# Patient Record
Sex: Male | Born: 1955 | Race: White | Hispanic: No | Marital: Married | Smoking: Former smoker
Health system: Southern US, Community
[De-identification: ages and names within clinical notes are randomized; demographics above are authoritative.]

## PROBLEM LIST (undated history)

## (undated) DIAGNOSIS — M1990 Unspecified osteoarthritis, unspecified site: Secondary | ICD-10-CM

## (undated) DIAGNOSIS — R7303 Prediabetes: Secondary | ICD-10-CM

## (undated) DIAGNOSIS — J90 Pleural effusion, not elsewhere classified: Secondary | ICD-10-CM

## (undated) HISTORY — PX: COLONOSCOPY: SHX174

## (undated) HISTORY — PX: TONSILLECTOMY: SUR1361

## (undated) HISTORY — PX: WISDOM TOOTH EXTRACTION: SHX21

## (undated) HISTORY — DX: Pleural effusion, not elsewhere classified: J90

---

## 2005-05-22 ENCOUNTER — Ambulatory Visit: Payer: Self-pay | Admitting: Sports Medicine

## 2005-06-19 ENCOUNTER — Ambulatory Visit: Payer: Self-pay | Admitting: Sports Medicine

## 2005-07-31 ENCOUNTER — Ambulatory Visit: Payer: Self-pay | Admitting: Sports Medicine

## 2005-10-23 ENCOUNTER — Ambulatory Visit: Payer: Self-pay | Admitting: Sports Medicine

## 2006-04-30 ENCOUNTER — Ambulatory Visit: Payer: Self-pay | Admitting: Family Medicine

## 2006-11-12 ENCOUNTER — Ambulatory Visit: Payer: Self-pay | Admitting: Sports Medicine

## 2006-11-12 DIAGNOSIS — M545 Low back pain, unspecified: Secondary | ICD-10-CM | POA: Insufficient documentation

## 2006-11-12 DIAGNOSIS — S63259A Unspecified dislocation of unspecified finger, initial encounter: Secondary | ICD-10-CM | POA: Insufficient documentation

## 2008-10-21 ENCOUNTER — Ambulatory Visit: Payer: Self-pay | Admitting: Sports Medicine

## 2008-10-21 DIAGNOSIS — M5430 Sciatica, unspecified side: Secondary | ICD-10-CM | POA: Insufficient documentation

## 2009-12-21 ENCOUNTER — Ambulatory Visit: Payer: Self-pay | Admitting: Sports Medicine

## 2009-12-21 DIAGNOSIS — M79609 Pain in unspecified limb: Secondary | ICD-10-CM | POA: Insufficient documentation

## 2010-02-02 ENCOUNTER — Ambulatory Visit: Payer: Self-pay | Admitting: Sports Medicine

## 2010-04-18 NOTE — Assessment & Plan Note (Signed)
Summary: F/U L CALF,MC   Vital Signs:  Patient profile:   55 year old male BP sitting:   139 / 92  Vitals Entered By: Lillia Pauls CMA (February 02, 2010 11:43 AM)  CC:  f/u L calf.  History of Present Illness: 55yo male to office for f/u of left partial calf tear. 85-90% improved. Running 20 miles per week without significant pain. Continues to use compression sleeve.  Doing home exercises regularly. Denies any bruising or swelling. Denies nighttime pain  Allergies: No Known Drug Allergies PMH-FH-SH reviewed for relevance  Review of Systems      See HPI  Physical Exam  General:  Well-developed,well-nourished,in no acute distress; alert,appropriate and cooperative throughout examination Msk:  LOWER EXT: - L calf: no gross deformity, no atrophy.  Palpable nodule in proximal, medial gastroc, area mildly TTP.  No erythema or bruising.  No achilles pain.  Normal strength at the ankle.  Able to do heel raises without difficutly.  Toe walk without difficutly.  Neurovascularly intact distally Additional Exam:  MSK U/S:  Left calf - hypoechoic region in proximal, medial gastroc.  Increased doppler flow in area.  Appears similar in size to previous u/s.  Images saved.   Impression & Recommendations:  Problem # 1:  CALF PAIN, LEFT (ICD-729.5)  - MSK u/s reveals persistent hypoechoic area consistent with partial gastroc tear with hematoma.  Appears slightly improved compared to previous u/s. - Cont wearing calf sleeve for compression - Cont. calf exercises/heel raises - Advance activity as tolerated - f/u as needed  Orders: [place] LIMITED ([id number])  Complete Medication List: 1)  Neurontin 600 Mg Tabs (Gabapentin) .... Take 1 tablet by mouth tid 2)  Tramadol Hcl 50 Mg Tabs (Tramadol hcl) .... Take 1 tablet by mouth every six hours   Orders Added: 1)  Est. Patient Level III [64403] 2)  [place] LIMITED [[id number]]

## 2010-04-18 NOTE — Assessment & Plan Note (Signed)
Summary: CALF PAIN,MC   Vital Signs:  Patient profile:   55 year old male Height:      70 inches Weight:      195 pounds BMI:     28.08 Pulse rate:   54 / minute BP sitting:   134 / 86  (right arm)  Vitals Entered By: Rochele Pages RN (December 21, 2009 11:47 AM) CC: lt calf pain x1 yr   CC:  lt calf pain x1 yr.  History of Present Illness: Patient presents to clinic today for lt calf pain for 1 year and refills on tramadol and neurontin.   He states that the pain started about 1 year ago during a run, he felt like his calf muscle pulled.   Patient continues to have a tightness in the calf during running that has decreased his mileage, in half as he is now just doing 2-3 miles at a time on the treadmill. He runs approx 14-15 miles per week.  The pain changes in locations moving from lt achilles up to the back of knee. Pain not present at night.  Mostly just feels a slight tugging sensation.  Back - overall doing well.  Tramadol and neurontin are working well.  Allergies: No Known Drug Allergies  Physical Exam  General:  Well-developed,well-nourished,in no acute distress; alert,appropriate and cooperative throughout examination Msk:  L calf: + more protuberant feel compared to the R calf.  Gross visualization shows obvious increased diameter of L calf > R calf.  + TTP on medial head of gastroc.  Nl strength with plantar/dorsaflexion and ever/inver.  Toe walks without issue.  Limited MSK [place] L calf: hypoechoic region deep to medial gastroc c/w pooled blood.  No active flow in that region. Neurologic:  alert & oriented X3.     Impression & Recommendations:  Problem # 1:  LOW BACK PAIN, CHRONIC (ICD-724.2) Assessment Unchanged Pain at baseline, tramadol 50 mg two times a day and neurontin 600 mg at bedtime refilled today  His updated medication list for this problem includes:    Tramadol Hcl 50 Mg Tabs (Tramadol hcl) .Marland Kitchen... Take 1 tablet by mouth every six hours  Problem # 2:   CALF PAIN, LEFT (ICD-729.5) Assessment: New  Based on history, PE, and [place], this is most c/w gastroc tear and residual blood pool that has not entirely reabsorbed. In addition, he likely has some non healed fibers and partially torn vessel branches that are continuing to bleed. - fitted with calf sleeve today - reviewed calf stretches and strength exercises today - ok to continue moderate exercise, but stop if sig pain to prevent full tear - f/u 6 weeks for repeat US scan  Orders: [place] LIMITED ([id number]) Garment,belt,sleeve or other covering ,elastic or similar stretch (U0454)  Complete Medication List: 1)  Neurontin 600 Mg Tabs (Gabapentin) .... Take 1 tablet by mouth tid 2)  Tramadol Hcl 50 Mg Tabs (Tramadol hcl) .... Take 1 tablet by mouth every six hours Prescriptions: TRAMADOL HCL 50 MG TABS (TRAMADOL HCL) Take 1 tablet by mouth every six hours  #120 x 6   Entered by:   Enid Baas MD   Authorized by:   Corbin Ade MD   Signed by:   Enid Baas MD on 12/21/2009   Method used:   Faxed to ...       [facility] [id number] (retail)       [address]       [place]  [place], [place]  [address]       Ph: [phone] or [phone]       Fax: [phone]   RxID:   [id number] NEURONTIN 600 MG TABS (GABAPENTIN) Take 1 tablet by mouth tid  #90 x 3   Entered by:   Enid Baas MD   Authorized by:   Corbin Ade MD   Signed by:   Enid Baas MD on 12/21/2009   Method used:   Faxed to ...       [facility] [id number] (retail)       [address]       [place], [place]  [address]       Ph: [phone] or [phone]       Fax: [phone]   RxID:   [id number]

## 2010-06-01 ENCOUNTER — Encounter: Payer: Self-pay | Admitting: *Deleted

## 2010-12-12 ENCOUNTER — Other Ambulatory Visit: Payer: Self-pay | Admitting: *Deleted

## 2010-12-12 MED ORDER — GABAPENTIN 600 MG PO TABS: 600.0000 mg | ORAL_TABLET | Freq: Three times a day (TID) | ORAL | Status: DC

## 2011-08-23 ENCOUNTER — Other Ambulatory Visit: Payer: Self-pay | Admitting: *Deleted

## 2011-08-23 MED ORDER — GABAPENTIN 600 MG PO TABS: 600.0000 mg | ORAL_TABLET | Freq: Three times a day (TID) | ORAL | Status: DC

## 2011-08-23 MED ORDER — TRAMADOL HCL 50 MG PO TABS: 50.0000 mg | ORAL_TABLET | Freq: Four times a day (QID) | ORAL | Status: DC | PRN

## 2011-08-23 NOTE — Progress Notes (Signed)
Refilled per Dr. Darrick Penna.  Asked that pt make a f/u appt within the next month.

## 2011-08-27 ENCOUNTER — Ambulatory Visit (INDEPENDENT_AMBULATORY_CARE_PROVIDER_SITE_OTHER): Payer: 59 | Admitting: Sports Medicine

## 2011-08-27 VITALS — BP 128/70

## 2011-08-27 DIAGNOSIS — M545 Low back pain, unspecified: Secondary | ICD-10-CM

## 2011-08-27 DIAGNOSIS — M79609 Pain in unspecified limb: Secondary | ICD-10-CM

## 2011-08-27 DIAGNOSIS — M5430 Sciatica, unspecified side: Secondary | ICD-10-CM

## 2011-08-27 MED ORDER — TRAMADOL HCL 50 MG PO TABS: 50.0000 mg | ORAL_TABLET | Freq: Four times a day (QID) | ORAL | Status: DC | PRN

## 2011-08-27 MED ORDER — GABAPENTIN 600 MG PO TABS: 600.0000 mg | ORAL_TABLET | Freq: Three times a day (TID) | ORAL | Status: DC

## 2011-08-27 NOTE — Assessment & Plan Note (Signed)
Pain is well controlled with low dose tramadol Use this medicine over next 12 mos  Reck then

## 2011-08-27 NOTE — Assessment & Plan Note (Signed)
Now having some pain in tarsal tunnel/ post tib  Keep up custom orthotics for running Try scaphoid pads in regular work shoes Take pressure off tarsal tunnel  Recheck if this returns

## 2011-08-27 NOTE — Assessment & Plan Note (Signed)
Well controlled with gabapentin  We are not weaning as he has had difficulty with sciatic sxs when he cuts back

## 2011-08-27 NOTE — Progress Notes (Signed)
Patient ID: Justin Mcdaniel, male   DOB: 09-Jul-1955, 56 y.o.   MRN: [id number]  HPI: Pt reports chronic low back pain for "many years" (since 2005).  Has been on the same med regimen for many years-- gabapentin 600 TID + tramadol 50mg  qAM and qPM.  Will occasionally try to cut back on the meds as rec'd by Dr. Darrick Penna, but will start having increased pain, including pain radiating down right leg.  When taking this regimen, he reports no pain, no numbness/tingling, no other problems/issues.  Did have one episode of pain on medial side of left ankle while running.  Pain began a few days ago 3.5 min into a run, and he was able to run for his full 12 min.  Can feel the area, but it is not painful.  No swelling or bruising around the area.  Full ROM. Cont to use custom orthotics Not using any support in his work shoes  PE: Filed Vitals:   08/27/11 0920  BP: 128/70   Gen: NAD, pleasant  Back: Negative straight leg raise bilaterally Decreased ROM 10-20 degrees on FABER on right Good ROM of RT hip Good abduction and rotation strength  Ankle (left): Full ROM Ligaments stable, w/o laxity No swelling or edema No TTP  Resting pronation is significant for ankle

## 2015-06-07 ENCOUNTER — Encounter: Payer: Self-pay | Admitting: Sports Medicine

## 2015-06-07 ENCOUNTER — Ambulatory Visit (INDEPENDENT_AMBULATORY_CARE_PROVIDER_SITE_OTHER): Payer: 59 | Admitting: Sports Medicine

## 2015-06-07 VITALS — BP 131/92 | HR 97 | Ht 70.0 in | Wt 195.0 lb

## 2015-06-07 DIAGNOSIS — M5431 Sciatica, right side: Secondary | ICD-10-CM

## 2015-06-07 DIAGNOSIS — R269 Unspecified abnormalities of gait and mobility: Secondary | ICD-10-CM

## 2015-06-07 NOTE — Assessment & Plan Note (Signed)
Patient was fitted for a : standard, cushioned, semi-rigid orthotic. The orthotic was heated and afterward the patient stood on the orthotic blank positioned on the orthotic stand. The patient was positioned in subtalar neutral position and 10 degrees of ankle dorsiflexion in a weight bearing stance. After completion of molding, a stable base was applied to the orthotic blank. The blank was ground to a stable position for weight bearing. Size: 12 red EVA Base: blue Med density EVA Posting: none Additional orthotic padding:none  I spent 45 mins face to face for preparation and evaluation.  I spent more than 50% of time for discussion and counsleing of shoulder issue, left knee pain and use of orthotics for long term prevention of his calf and low back issues.

## 2015-06-07 NOTE — Progress Notes (Signed)
Patient ID: Justin Mcdaniel, male   DOB: [birth year], 60 y.o.   MRN: [id number]  CC; replace orthotics  - note patient also c/o RT shoulder and left knee pain recently  HPI Patient in orthotics for 10 years He had recurrent calf and lower leg pain This resolved in orthotics and has returned when he was off his orthotics Current orthotics > 60 years old and starting to break down so comes for new ones  RT shoulder pain Aches at night Hurts with movement x chest or heavy lift  LT knee Popping and tender shortly after finishing a 2 mi run on TM Hurt on ant knee milsswelling Resolved in 48 hours Concerned but no new pain  PEXAM NAD BP 131/92 mmHg  Pulse 97  Ht 5\' 10"  (1.778 m)  Wt 195 lb (88.451 kg)  BMI 27.98 kg/m2  Shoulder: Inspection reveals no abnormalities, atrophy or asymmetry. Palpation is normal with no tenderness over AC joint or bicipital groove. ROM is full in all planes. Rotator cuff strength normal throughout. No signs of impingement with negative Neer and Hawkin's tests, empty can. Speeds and Yergason's tests normal.  Probable  labral pathology noted with positive Obrien's,  negative clunk and good stability. Normal scapular function observed. No painful arc and no drop arm sign. No apprehension sign  Left knee Knee: Normal to inspection with no erythema or effusion or obvious bony abnormalities. Palpation normal with no warmth or joint line tenderness or patellar tenderness or condyle tenderness. ROM normal in flexion and extension and lower leg rotation. Ligaments with solid consistent endpoints including ACL, PCL, LCL, MCL. Negative Mcmurray's and provocative meniscal tests. Non painful patellar compression.but he does have some crepitation in upper quadrants of PF groove with compression Patellar and quadriceps tendons unremarkable. Hamstring and quadriceps strength is normal.  Feet  Loss of long arch Left foot shows a plantar callus Some loss of  transverse arch  Gait prior to orthotics is pronated  Assess: See problem list for gait and LBP RT shoulder with degenerative labrum - suggest maintain RC strength/ avoid IR with lifting or exercises  Left knee - exam normal today/ suspect he has had some small osteochondral fragment break lose but no significant DJD based on my exam today

## 2015-06-07 NOTE — Assessment & Plan Note (Signed)
Relieved with Gabapentin and tramadol Off both now Lessened with orthotics

## 2017-06-17 DIAGNOSIS — Z125 Encounter for screening for malignant neoplasm of prostate: Secondary | ICD-10-CM | POA: Diagnosis not present

## 2017-06-17 DIAGNOSIS — Z0000 Encounter for general adult medical examination without abnormal findings: Secondary | ICD-10-CM | POA: Diagnosis not present

## 2017-06-17 DIAGNOSIS — R7309 Other abnormal glucose: Secondary | ICD-10-CM | POA: Diagnosis not present

## 2017-06-19 DIAGNOSIS — Z87820 Personal history of traumatic brain injury: Secondary | ICD-10-CM | POA: Diagnosis not present

## 2017-06-19 DIAGNOSIS — Z0000 Encounter for general adult medical examination without abnormal findings: Secondary | ICD-10-CM | POA: Diagnosis not present

## 2017-06-19 DIAGNOSIS — Z8781 Personal history of (healed) traumatic fracture: Secondary | ICD-10-CM | POA: Diagnosis not present

## 2017-06-19 DIAGNOSIS — Z1212 Encounter for screening for malignant neoplasm of rectum: Secondary | ICD-10-CM | POA: Diagnosis not present

## 2017-06-19 DIAGNOSIS — J309 Allergic rhinitis, unspecified: Secondary | ICD-10-CM | POA: Diagnosis not present

## 2018-06-19 DIAGNOSIS — Z1159 Encounter for screening for other viral diseases: Secondary | ICD-10-CM | POA: Diagnosis not present

## 2018-06-19 DIAGNOSIS — Z125 Encounter for screening for malignant neoplasm of prostate: Secondary | ICD-10-CM | POA: Diagnosis not present

## 2018-06-19 DIAGNOSIS — Z13220 Encounter for screening for lipoid disorders: Secondary | ICD-10-CM | POA: Diagnosis not present

## 2018-06-19 DIAGNOSIS — Z0000 Encounter for general adult medical examination without abnormal findings: Secondary | ICD-10-CM | POA: Diagnosis not present

## 2018-06-23 DIAGNOSIS — M545 Low back pain: Secondary | ICD-10-CM | POA: Diagnosis not present

## 2018-06-23 DIAGNOSIS — Z23 Encounter for immunization: Secondary | ICD-10-CM | POA: Diagnosis not present

## 2018-06-23 DIAGNOSIS — Z0000 Encounter for general adult medical examination without abnormal findings: Secondary | ICD-10-CM | POA: Diagnosis not present

## 2018-06-23 DIAGNOSIS — F5104 Psychophysiologic insomnia: Secondary | ICD-10-CM | POA: Diagnosis not present

## 2018-06-23 DIAGNOSIS — R7309 Other abnormal glucose: Secondary | ICD-10-CM | POA: Diagnosis not present

## 2019-06-23 ENCOUNTER — Encounter: Payer: Self-pay | Admitting: Cardiovascular Disease

## 2019-06-23 DIAGNOSIS — Z0000 Encounter for general adult medical examination without abnormal findings: Secondary | ICD-10-CM | POA: Diagnosis not present

## 2019-06-23 DIAGNOSIS — Z125 Encounter for screening for malignant neoplasm of prostate: Secondary | ICD-10-CM | POA: Diagnosis not present

## 2019-06-26 DIAGNOSIS — Z0000 Encounter for general adult medical examination without abnormal findings: Secondary | ICD-10-CM | POA: Diagnosis not present

## 2019-06-26 DIAGNOSIS — J309 Allergic rhinitis, unspecified: Secondary | ICD-10-CM | POA: Diagnosis not present

## 2019-06-26 DIAGNOSIS — Z1212 Encounter for screening for malignant neoplasm of rectum: Secondary | ICD-10-CM | POA: Diagnosis not present

## 2019-06-26 DIAGNOSIS — R7309 Other abnormal glucose: Secondary | ICD-10-CM | POA: Diagnosis not present

## 2019-06-26 DIAGNOSIS — Z8249 Family history of ischemic heart disease and other diseases of the circulatory system: Secondary | ICD-10-CM | POA: Diagnosis not present

## 2019-06-26 DIAGNOSIS — Z87891 Personal history of nicotine dependence: Secondary | ICD-10-CM | POA: Diagnosis not present

## 2019-07-29 DIAGNOSIS — J438 Other emphysema: Secondary | ICD-10-CM | POA: Diagnosis not present

## 2019-07-29 DIAGNOSIS — Z79899 Other long term (current) drug therapy: Secondary | ICD-10-CM | POA: Diagnosis not present

## 2019-07-29 DIAGNOSIS — Z1159 Encounter for screening for other viral diseases: Secondary | ICD-10-CM | POA: Diagnosis not present

## 2019-07-29 DIAGNOSIS — J8410 Pulmonary fibrosis, unspecified: Secondary | ICD-10-CM | POA: Diagnosis not present

## 2019-07-29 DIAGNOSIS — I2510 Atherosclerotic heart disease of native coronary artery without angina pectoris: Secondary | ICD-10-CM | POA: Diagnosis not present

## 2019-08-06 ENCOUNTER — Encounter: Payer: Self-pay | Admitting: Cardiovascular Disease

## 2019-08-06 DIAGNOSIS — I2510 Atherosclerotic heart disease of native coronary artery without angina pectoris: Secondary | ICD-10-CM | POA: Diagnosis not present

## 2019-08-07 ENCOUNTER — Ambulatory Visit: Payer: BC Managed Care – PPO | Admitting: Internal Medicine

## 2019-08-07 ENCOUNTER — Other Ambulatory Visit: Payer: Self-pay

## 2019-08-07 ENCOUNTER — Encounter: Payer: Self-pay | Admitting: Internal Medicine

## 2019-08-07 VITALS — BP 130/80 | HR 50 | Temp 97.7°F | Ht 70.0 in | Wt 188.4 lb

## 2019-08-07 DIAGNOSIS — J8410 Pulmonary fibrosis, unspecified: Secondary | ICD-10-CM | POA: Diagnosis not present

## 2019-08-07 DIAGNOSIS — J432 Centrilobular emphysema: Secondary | ICD-10-CM

## 2019-08-07 NOTE — Progress Notes (Addendum)
Justin Mcdaniel    [id number]    1956-03-11  Primary Care Physician:Pharr, Thayer Jew, MD  Referring Physician: Deland Pretty, MD [address] [place],  [place] [address] Reason for Consultation: abnormal CT Chest Date of Consultation: 08/07/2019  Chief complaint:   Chief Complaint  Patient presents with  . Consult    pulmonary granuloma.  Emphysema.       HPI: Justin Mcdaniel is a 64 y.o. who presents for new patient evaluation of an abnormal CT Chest.  He has a significant cardiac history in the family.  He had a CT cardiac calcium score which was elevated. He had an echocardiogram done yesterday to follow up on this. Noted to have calcified pulmonary and splenic granulomas as well as emphysema on his CT scan as an incidental finding. Referred here for further discussion of these findings.    No shortness of breath, cough, pneumonia, bronchitis. No fevers chills night sweats or weight loss. Runs 4-5 miles a day. Splits his time between here and [place].   Had a pleural effusion in 2016 while in [place] and was treated with antibiotics. No residenual pain or symptoms.  Social history: Retired from [employer], environmental - worked in Scientist, water quality  Lived in [place] for the last 20 years.  Before that lived all over the [place].   Social History   Occupational History  . Not on file  Tobacco Use  . Smoking status: Former Smoker    Packs/day: 1.50    Years: 20.00    Pack years: 30.00    Types: Cigarettes    Quit date: 08/07/2010    Years since quitting: 9.0  . Smokeless tobacco: Never Used  Substance and Sexual Activity  . Alcohol use: Not on file  . Drug use: Not on file  . Sexual activity: Not on file    Relevant family history:  Family History  Problem Relation Age of Onset  . CAD Mother   . CAD Father   . CAD Brother     Past Medical History:  Diagnosis Date  . Pleural effusion     History reviewed. No pertinent surgical  history.  Physical Exam: Blood pressure 130/80, pulse (!) 50, temperature 97.7 F (36.5 C), temperature source Temporal, height 5\' 10"  (1.778 m), weight 188 lb 6.4 oz (85.5 kg), SpO2 98 %. Gen:      No acute distress ENT:  no nasal polyps, mucus membranes moist Lungs:    No increased respiratory effort, symmetric chest wall excursion, clear to auscultation bilaterally, no wheezes or crackles CV:         Regular rate and rhythm; no murmurs, rubs, or gallops.  No pedal edema Abd:      + bowel sounds; soft, non-tender; no distension MSK: no acute synovitis of DIP or PIP joints, no mechanics hands.  Skin:      Warm and dry; no rashes Neuro: normal speech, no focal facial asymmetry Psych: alert and oriented x3, normal mood and affect   Data Reviewed/Medical Decision Making:  Independent interpretation of tests: Imaging: Report of CT cardiac calcium scoring with emphysema, calcified granulomas and hilar adenopathy  ADDENDUM 5/26 Was able to review lung windows with patient's CT scan - mildly enlarged calcified hilar adenopathy. Mild centrilobular emphysema and sub cm granulomas noted.  PFTs: None on file  Labs: Labs reviewed from PCP. CBC and UA wnl.   Immunization status:  Immunization History  Administered Date(s) Administered  .  Influenza,inj,Quad PF,6+ Mos 12/08/2018  . Moderna SARS-COVID-2 Vaccination 07/02/2019, 07/23/2019  . Zoster 08/06/2017    . I reviewed prior external note(s) from Dr. Shelia Media . I reviewed the result(s) of the labs and imaging as noted above.  . 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  No further work up or follow up is needed.   Will need to obtain CD of his images for review. He will obtain and drop off.  Given that he is asymptomatic from his emphysema will hold off on any further testing or medications. He will let us know if this changes.    I spent 60 minutes in the care of this patient today including pre-charting, chart review, review of results, face-to-face care, coordination of care and communication with consultants etc.).  Return to Care: Return shortness of breath.  Lenice Llamas, MD Pulmonary and [place]  CC: Deland Pretty, MD

## 2019-08-07 NOTE — Patient Instructions (Signed)
Please drop off the CD from your imaging center to our office for our review.

## 2019-08-10 ENCOUNTER — Telehealth: Payer: Self-pay | Admitting: Internal Medicine

## 2019-08-10 NOTE — Telephone Encounter (Signed)
Dr. Shearon Stalls, please look out for disc.    Patient Instructions by Spero Geralds, MD at 08/07/2019 9:30 AM Author: Spero Geralds, MD Author Type: Physician Filed: 08/07/2019 9:56 AM  Note Status: Signed Cosign: Cosign Not Required Encounter Date: 08/07/2019  Editor: Spero Geralds, MD (Physician)    Please drop off the CD from your imaging center to our office for our review.     Instructions    Return shortness of breath. Please drop off the CD from your imaging center to our office for our review.        After Visit Summary (Printed 08/07/2019)

## 2019-08-11 NOTE — Telephone Encounter (Signed)
Checked Dr. Mauricio Po file up front and also in the cabinet in [place] and did not see a disc in there.  Heather or Dr. Shearon Stalls, please advise if you have the disc that was dropped off.

## 2019-08-12 NOTE — Telephone Encounter (Signed)
He wasn't feeling any symptoms of shortness of breath so I held off on ordering any further testing. I don't think it's absolutely essential he has any PFTs at this time - he can come see [place] again as needed if his symptoms change.

## 2019-08-12 NOTE — Telephone Encounter (Signed)
Will keep encounter open to call pt at requested time.

## 2019-08-12 NOTE — Telephone Encounter (Signed)
Attempted to call pt but unable to reach. Left message for him to return call. °

## 2019-08-12 NOTE — Telephone Encounter (Signed)
I reviewed the CT scan today and my findings are consistent with what was discussed in our office visit. He has mild emphysema and a few small calcified granulomas and lymph nodes which do not need any further follow up.  I've left the disc in my folder if he would like to come pick it up. Please call patient.

## 2019-08-12 NOTE — Telephone Encounter (Signed)
Pt returning a phone call. Pt can be reached at [phone]

## 2019-08-12 NOTE — Telephone Encounter (Signed)
Independent interpretation of tests: Imaging: Report of CT cardiac calcium scoring with emphysema, calcified granulomas and hilar adenopathy  ADDENDUM 5/26 Was able to review lung windows with patient's CT scan - mildly enlarged calcified hilar adenopathy. Mild centrilobular emphysema and sub cm granulomas noted.  PFTs: None on file  Labs: Labs reviewed from PCP. CBC and UA wnl.   Immunization status:      Immunization History  Administered Date(s) Administered  . Influenza,inj,Quad PF,6+ Mos 12/08/2018  . Moderna SARS-COVID-2 Vaccination 07/02/2019, 07/23/2019  . Zoster 08/06/2017    . I reviewed prior external note(s) from Dr. Shelia Media . I reviewed the result(s) of the labs and imaging as noted above.  . I have ordered PFT   Called and spoke with pt letting him know the info stated by Dr. Shearon Stalls in regards to the results of the CT from the disc that pt brought by and pt verbalized understanding. Stated to pt to follow plan that was discussed at [place] and while I was looking at the [place], saw that it stated that Dr. Shearon Stalls said that she had ordered PFT. After stating that to pt, pt stated he was told by Dr. Shearon Stalls that he did not need to schedule a follow up. No order was placed for the PFT; it was just mentioned in her notes.  Dr. Shearon Stalls, please advise if you were wanting pt to have a PFT or if that was just documented as a mistake.

## 2019-08-12 NOTE — Telephone Encounter (Signed)
Attempted to call pt to let him know the info stated by Dr. Shearon Stalls but unable to reach. When speaking with pt the prior time, pt stated it was okay to leave him a detailed message on machine. Left pt a detailed message with the info stated by Dr. Shearon Stalls. I have placed the disc up front in the file cabinet for pt to come and pick up when he returns back to town. Nothing further needed.

## 2019-08-12 NOTE — Telephone Encounter (Signed)
Patient is available at 2:00 pm or 4:00 pm. Patient phone number is [phone].

## 2019-09-04 ENCOUNTER — Encounter: Payer: Self-pay | Admitting: Cardiovascular Disease

## 2019-09-04 ENCOUNTER — Ambulatory Visit: Payer: BC Managed Care – PPO | Admitting: Cardiovascular Disease

## 2019-09-04 ENCOUNTER — Other Ambulatory Visit: Payer: Self-pay

## 2019-09-04 DIAGNOSIS — E782 Mixed hyperlipidemia: Secondary | ICD-10-CM | POA: Diagnosis not present

## 2019-09-04 DIAGNOSIS — Z8249 Family history of ischemic heart disease and other diseases of the circulatory system: Secondary | ICD-10-CM

## 2019-09-04 DIAGNOSIS — R931 Abnormal findings on diagnostic imaging of heart and coronary circulation: Secondary | ICD-10-CM | POA: Diagnosis not present

## 2019-09-04 DIAGNOSIS — E785 Hyperlipidemia, unspecified: Secondary | ICD-10-CM | POA: Insufficient documentation

## 2019-09-04 NOTE — Progress Notes (Signed)
09/04/2019 Justin Mcdaniel   March 04, 1956  [id number]  Primary Physician Deland Pretty, MD Primary Cardiologist: Lorretta Harp MD Lupe Carney, [place]  HPI:  Justin Mcdaniel is a 64 y.o. is a thin and fit appearing married Caucasian male father of 4 children, grandfather of 4 grandchildren is been retired for the last 2-1/2 years from working at [employer], formally [place].  He was referred to me by Dr. Shelia Media, his PCP, because of an elevated coronary calcium score.  I apparently seen him 10 years ago for cardiac evaluation because of an abnormal EKG.  His risk factors include family history with a brother who had multiple stents and a mother who has had stents in an elderly age.  He smoked remotely, stopped in 2012 after smoking a pack a day for 25 years.  He has untreated hyperlipidemia.  Is never had a heart attack or stroke.  He is fairly active and runs 4 to 5 miles a day.  Recent coronary calcium score performed at [facility] on 07/27/2019 was 1951 with calcium in all 3 coronary arteries.  Recent 2D echocardiogram performed 08/06/2019 revealed normal LV systolic function without any valvular abnormalities.   Current Meds  Medication Sig  . nitroGLYCERIN (NITROSTAT) 0.4 MG SL tablet DISSOLVE 1 TABLET UNDER THE TONGUE EVERY 5 MINUTES UP TO 3 TABS FOR CHEST PAIN. CALL 911 AT 15 MINUTES  . OVER THE COUNTER MEDICATION Take 81 mg by mouth daily. Baby asprin  . rosuvastatin (CRESTOR) 20 MG tablet Take 20 mg by mouth daily.  Marland Kitchen zolpidem (AMBIEN) 10 MG tablet      Allergies  Allergen Reactions  . Levofloxacin     Other reaction(s): Other (See Comments) Tendon apathy    Social History   Socioeconomic History  . Marital status: Married    Spouse name: Not on file  . Number of children: Not on file  . Years of education: Not on file  . Highest education level: Not on file  Occupational History  . Not on file  Tobacco Use  . Smoking status: Former Smoker    Packs/day: 1.50     Years: 20.00    Pack years: 30.00    Types: Cigarettes    Quit date: 08/07/2010    Years since quitting: 9.0  . Smokeless tobacco: Never Used  Substance and Sexual Activity  . Alcohol use: Not on file  . Drug use: Not on file  . Sexual activity: Not on file  Other Topics Concern  . Not on file  Social History Narrative  . Not on file   Social Determinants of Health   Financial Resource Strain:   . Difficulty of Paying Living Expenses:   Food Insecurity:   . Worried About Charity fundraiser in the Last Year:   . Arboriculturist in the Last Year:   Transportation Needs:   . Film/video editor (Medical):   Marland Kitchen Lack of Transportation (Non-Medical):   Physical Activity:   . Days of Exercise per Week:   . Minutes of Exercise per Session:   Stress:   . Feeling of Stress :   Social Connections:   . Frequency of Communication with Friends and Family:   . Frequency of Social Gatherings with Friends and Family:   . Attends Religious Services:   . Active Member of Clubs or Organizations:   . Attends Archivist Meetings:   Marland Kitchen Marital Status:   Intimate Partner Violence:   .  Fear of Current or Ex-Partner:   . Emotionally Abused:   Marland Kitchen Physically Abused:   . Sexually Abused:      Review of Systems: General: negative for chills, fever, night sweats or weight changes.  Cardiovascular: negative for chest pain, dyspnea on exertion, edema, orthopnea, palpitations, paroxysmal nocturnal dyspnea or shortness of breath Dermatological: negative for rash Respiratory: negative for cough or wheezing Urologic: negative for hematuria Abdominal: negative for nausea, vomiting, diarrhea, bright red blood per rectum, melena, or hematemesis Neurologic: negative for visual changes, syncope, or dizziness All other systems reviewed and are otherwise negative except as noted above.    Blood pressure (!) 154/88, pulse (!) 44, height 5' 10.5" (1.791 m), weight 191 lb 6.4 oz (86.8 kg), SpO2 97  %.  General appearance: alert and no distress Neck: no adenopathy, no carotid bruit, no JVD, supple, symmetrical, trachea midline and thyroid not enlarged, symmetric, no tenderness/mass/nodules Lungs: clear to auscultation bilaterally Heart: regular rate and rhythm, S1, S2 normal, no murmur, click, rub or gallop Extremities: extremities normal, atraumatic, no cyanosis or edema Pulses: 2+ and symmetric Skin: Skin color, texture, turgor normal. No rashes or lesions Neurologic: Alert and oriented X 3, normal strength and tone. Normal symmetric reflexes. Normal coordination and gait  EKG sinus bradycardia 44 without ST or T wave changes.  Personally reviewed this EKG.  ASSESSMENT AND PLAN:   Elevated coronary artery calcium score [name] is a former patient of mine who I saw over 10 years ago at that time he was evaluated for an abnormal EKG and was cleared of heart disease.  His cardiac risk factors are family history and recently treated hyperlipidemia.  He runs 4 to 5 miles a day without symptoms.  Recent coronary calcium score performed at [facility] [place] on 07/27/2019 was 1951 with calcium and left main and all 3 vessels.  Based on this I am going to get an exercise Myoview stress test to rule out obstructive disease which I suspect will be normal.  Hyperlipidemia Recent blood work by his PCP performed 06/24/2019 revealed a total cholesterol 193, LDL of 107 and HDL of 70.  Based on this he was begun on rosuvastatin 20 mg a day and is followed by his PCP.  Family history of heart disease Mother had CAD and advanced age and brother who was about the same age has had multiple stents.      Lorretta Harp MD FACP,FACC,FAHA, [facility] 09/04/2019 9:49 AM

## 2019-09-04 NOTE — Patient Instructions (Signed)
Medication Instructions:  Your Physician recommend you continue on your current medication as directed.    *If you need a refill on your cardiac medications before your next appointment, please call your pharmacy*   Lab Work: None   Testing/Procedures: Your physician has requested that you have en exercise stress myoview. For further information please visit [email or web address]. Please follow instruction sheet, as given. Portal. [address]    Follow-Up: At [facility], you and your health needs are our priority.  As part of our continuing mission to provide you with exceptional heart care, we have created designated Provider Care Teams.  These Care Teams include your primary Cardiologist (physician) and Advanced Practice Providers (APPs -  Physician Assistants and Nurse Practitioners) who all work together to provide you with the care you need, when you need it.  We recommend signing up for the patient portal called "MyChart".  Sign up information is provided on this After Visit Summary.  MyChart is used to connect with patients for Virtual Visits (Telemedicine).  Patients are able to view lab/test results, encounter notes, upcoming appointments, etc.  Non-urgent messages can be sent to your provider as well.   To learn more about what you can do with MyChart, go to [email or web address].    Your next appointment:   As needed  The format for your next appointment:   Either In Person or Virtual  Provider:   Dr. Andria Rhein are scheduled for a Myocardial Perfusion Imaging Study..  Please arrive 15 minutes prior to your appointment time for registration and insurance purposes.  The test will take approximately 3 to 4 hours to complete; you may bring reading material.  If someone comes with you to your appointment, they will need to remain in the main lobby due to limited space in the testing area. **If you are pregnant or breastfeeding, please notify the nuclear lab  prior to your appointment**  How to prepare for your Myocardial Perfusion Test: . Do not eat or drink 3 hours prior to your test, except you may have water. . Do not consume products containing caffeine (regular or decaffeinated) 12 hours prior to your test. (ex: coffee, chocolate, sodas, tea). . Do bring a list of your current medications with you.  If not listed below, you may take your medications as normal. . Do wear comfortable clothes (no dresses or overalls) and walking shoes, tennis shoes preferred (No heels or open toe shoes are allowed). . Do NOT wear cologne, perfume, aftershave, or lotions (deodorant is allowed). . If these instructions are not followed, your test will have to be rescheduled.  Please report to [place], [address] for your test.  If you have questions or concerns about your appointment, you can call the Nuclear Lab at [phone].  If you cannot keep your appointment, please provide 24 hours notification to the Nuclear Lab, to avoid a possible $50 charge to your account.

## 2019-09-04 NOTE — Assessment & Plan Note (Signed)
Recent blood work by his PCP performed 06/24/2019 revealed a total cholesterol 193, LDL of 107 and HDL of 70.  Based on this he was begun on rosuvastatin 20 mg a day and is followed by his PCP.

## 2019-09-04 NOTE — Assessment & Plan Note (Signed)
Mother had CAD and advanced age and brother who was about the same age has had multiple stents.

## 2019-09-04 NOTE — Assessment & Plan Note (Signed)
Justin Mcdaniel is a former patient of mine who I saw over 10 years ago at that time he was evaluated for an abnormal EKG and was cleared of heart disease.  His cardiac risk factors are family history and recently treated hyperlipidemia.  He runs 4 to 5 miles a day without symptoms.  Recent coronary calcium score performed at [facility] [place] on 07/27/2019 was 1951 with calcium and left main and all 3 vessels.  Based on this I am going to get an exercise Myoview stress test to rule out obstructive disease which I suspect will be normal.

## 2019-09-11 ENCOUNTER — Telehealth (HOSPITAL_COMMUNITY): Payer: Self-pay

## 2019-09-11 NOTE — Telephone Encounter (Signed)
Encounter complete. 

## 2019-09-15 ENCOUNTER — Encounter (HOSPITAL_COMMUNITY): Payer: BC Managed Care – PPO

## 2019-09-16 ENCOUNTER — Ambulatory Visit (HOSPITAL_COMMUNITY)
Admission: RE | Admit: 2019-09-16 | Discharge: 2019-09-16 | Disposition: A | Discharge status: Home / Self Care | Payer: BC Managed Care – PPO | Source: Ambulatory Visit | Attending: Cardiovascular Disease | Admitting: Cardiovascular Disease

## 2019-09-16 ENCOUNTER — Other Ambulatory Visit: Payer: Self-pay

## 2019-09-16 DIAGNOSIS — Z8249 Family history of ischemic heart disease and other diseases of the circulatory system: Secondary | ICD-10-CM

## 2019-09-16 DIAGNOSIS — E782 Mixed hyperlipidemia: Secondary | ICD-10-CM | POA: Insufficient documentation

## 2019-09-16 DIAGNOSIS — R931 Abnormal findings on diagnostic imaging of heart and coronary circulation: Secondary | ICD-10-CM | POA: Insufficient documentation

## 2019-09-16 LAB — MYOCARDIAL PERFUSION IMAGING
Estimated workload: 17.5 METS
Exercise duration (min): 15 min
Exercise duration (sec): 30 s
LV dias vol: 124 mL (ref 62–150)
LV sys vol: 52 mL
MPHR: 156 {beats}/min
Peak HR: 142 {beats}/min
Percent HR: 91 %
Rest HR: 45 {beats}/min
SDS: 0
SRS: 0
SSS: 0
TID: 0.92

## 2019-09-16 MED ORDER — TECHNETIUM TC 99M TETROFOSMIN IV KIT
11.0000 | PACK | Freq: Once | INTRAVENOUS | Status: AC | PRN
  Administered 2019-09-16: 11 via INTRAVENOUS
  Filled 2019-09-16: qty 11

## 2019-09-16 MED ORDER — TECHNETIUM TC 99M TETROFOSMIN IV KIT
31.2000 | PACK | Freq: Once | INTRAVENOUS | Status: AC | PRN
  Administered 2019-09-16: 31.2 via INTRAVENOUS
  Filled 2019-09-16: qty 32

## 2019-09-28 DIAGNOSIS — I2510 Atherosclerotic heart disease of native coronary artery without angina pectoris: Secondary | ICD-10-CM | POA: Diagnosis not present

## 2019-09-30 DIAGNOSIS — I2510 Atherosclerotic heart disease of native coronary artery without angina pectoris: Secondary | ICD-10-CM | POA: Diagnosis not present

## 2019-09-30 DIAGNOSIS — J8410 Pulmonary fibrosis, unspecified: Secondary | ICD-10-CM | POA: Diagnosis not present

## 2020-06-27 DIAGNOSIS — Z0000 Encounter for general adult medical examination without abnormal findings: Secondary | ICD-10-CM | POA: Diagnosis not present

## 2020-06-27 DIAGNOSIS — Z125 Encounter for screening for malignant neoplasm of prostate: Secondary | ICD-10-CM | POA: Diagnosis not present

## 2021-01-19 DIAGNOSIS — K648 Other hemorrhoids: Secondary | ICD-10-CM | POA: Diagnosis not present

## 2021-01-19 DIAGNOSIS — K621 Rectal polyp: Secondary | ICD-10-CM | POA: Diagnosis not present

## 2021-01-19 DIAGNOSIS — Z1211 Encounter for screening for malignant neoplasm of colon: Secondary | ICD-10-CM | POA: Diagnosis not present

## 2021-01-19 DIAGNOSIS — D123 Benign neoplasm of transverse colon: Secondary | ICD-10-CM | POA: Diagnosis not present

## 2021-01-19 DIAGNOSIS — D120 Benign neoplasm of cecum: Secondary | ICD-10-CM | POA: Diagnosis not present

## 2021-01-19 DIAGNOSIS — Z86010 Personal history of colonic polyps: Secondary | ICD-10-CM | POA: Diagnosis not present

## 2021-01-19 DIAGNOSIS — K5730 Diverticulosis of large intestine without perforation or abscess without bleeding: Secondary | ICD-10-CM | POA: Diagnosis not present

## 2021-01-23 DIAGNOSIS — I2510 Atherosclerotic heart disease of native coronary artery without angina pectoris: Secondary | ICD-10-CM | POA: Diagnosis not present

## 2021-01-23 DIAGNOSIS — R7309 Other abnormal glucose: Secondary | ICD-10-CM | POA: Diagnosis not present

## 2021-04-18 ENCOUNTER — Ambulatory Visit: Payer: Medicare HMO | Admitting: Sports Medicine

## 2021-04-18 ENCOUNTER — Ambulatory Visit: Payer: Self-pay

## 2021-04-18 ENCOUNTER — Other Ambulatory Visit: Payer: Self-pay

## 2021-04-18 VITALS — BP 122/84 | Ht 70.0 in | Wt 180.0 lb

## 2021-04-18 DIAGNOSIS — S32313A Displaced avulsion fracture of unspecified ilium, initial encounter for closed fracture: Secondary | ICD-10-CM

## 2021-04-18 DIAGNOSIS — M25551 Pain in right hip: Secondary | ICD-10-CM

## 2021-04-18 NOTE — Progress Notes (Signed)
PCP: Deland Pretty, MD  Subjective:   HPI: Patient is a 66 y.o. male here for right anterior hip pain.  Justin Mcdaniel is here for right anterior hip injury about 18 months ago.  Patient is a frequent runner and was near the end of his 4 mile run when he reached out to planted with the right foot and felt a sharp pain in the proximal anterior thigh.  He states he felt like he tore something.  He denies any redness or erythema, may be some mild swelling around the area.  He had to take a break from running and activity for a few days, but then after a week was able to slowly get back into some walking and some gentle running.  He has returned to running but has cut back on his distances and his pace because of the discomfort.  He is not taking anything in terms of medication.  Denies any previous injury to this hip.  Denies any pain radiating into the groin or testicle.  The pain is somewhat constant as a dull ache, but will be exacerbated by hip flexion motions.   Past Medical History:  Diagnosis Date   Pleural effusion     Current Outpatient Medications on File Prior to Visit  Medication Sig Dispense Refill   nitroGLYCERIN (NITROSTAT) 0.4 MG SL tablet DISSOLVE 1 TABLET UNDER THE TONGUE EVERY 5 MINUTES UP TO 3 TABS FOR CHEST PAIN. CALL 911 AT 15 MINUTES     OVER THE COUNTER MEDICATION Take 81 mg by mouth daily. Baby asprin     rosuvastatin (CRESTOR) 20 MG tablet Take 20 mg by mouth daily.     zolpidem (AMBIEN) 10 MG tablet      No current facility-administered medications on file prior to visit.    No past surgical history on file.  Allergies  Allergen Reactions   Levofloxacin     Other reaction(s): Other (See Comments) Tendon apathy    BP 122/84    Ht 5\' 10"  (1.778 m)    Wt 180 lb (81.6 kg)    BMI 25.83 kg/m   [facility] 04/18/2021  Frequency of aerobic exercise (# of days/week) 7  Average time in minutes 30  Frequency of strengthening activities (# of  days/week) 0    No flowsheet data found.      Objective:  Physical Exam:  Gen: Well-appearing, in no acute distress; non-toxic CV: Regular Rate. Well-perfused. Warm.  Resp: Breathing unlabored on room air; no wheezing. Psych: Fluid speech in conversation; appropriate affect; normal thought process Neuro: Sensation intact throughout. No gross coordination deficits.  MSK:  - Right hip: + Mild TTP noted just distal to the AIIS.  No ASIS TTP, no greater trochanter TTP.  Inspection yields no erythema, ecchymosis or swelling.  Passive logroll equivalent bilaterally.  Range of motion full in all directions.  There is some mild pain with resisted hip flexion otherwise 5/5 strength intact.  Nonantalgic gait.  Negative FABER/FADIR testing.  Mild provocation with Marcello Moores testing.  Neurovascular intact distally.  MSK Limited Anterior Hip ultrasound performed, right  -Long axis evaluation of the ASIS was evaluated without cortical irregularity, appropriate insertion of the sartorius muscle without evidence of tearing -Long and short axis evaluation of the AIIS was seen with avulsion fracture noted of the proximal rectus femoris. There is some tendinopathic changes of the proximal rectus femoris near the insertion point, although no gross tearing of the tendon. -Limited evaluation of the hip joint  was visualized with small calcification near the labrum, no joint effusion noted  IMPRESSION: AIIS avulsion fraction with tendinopathic changes of the rectus femoris at the insertion site, no gross tearing  Ultrasound and interpretation by Dr. Rolena Infante and [name], MD      Assessment & Plan:  1. Right anterior hip pain 2. AIIS proximal avulsion fracture  3.  Rectus femoris tendinopathy  -Given that this injury was 1.5 years ago, the patient has compensated and rehabbed quite well on his own -Home exercises provided with emphasis on hip flexors, hip stabilization with external rotation, dynamic and  static lunges -He may increase his running pace and distance as his pain allows, no restrictions at this point -He will follow-up in about 6 weeks to see how he is doing -We did discuss the option of nitroglycerin patches, he will hold on these for now and see what kind of benefit he gets from HEP first  Elba Barman, DO PGY-4, [place]  I observed and examined the patient with the [facility] resident and agree with assessment and plan.  Note reviewed and modified by me. Ila Mcgill, MD

## 2021-04-18 NOTE — Patient Instructions (Signed)
It was great to meet you today, thank you for letting me participate in your care!  Today, we discussed your right hip pain. Based on our ultrasound, we were able to determine that you had a AIIS (anterior inferior iliac spine) avulsion fracture -this is where one of your hip flexor muscles, rectus femoris, pulled off a very small piece of bone.  Things to do for this: -Hip flexion exercises, hip external rotation, lunges and lunges on a step -Continue to have good active warm up and stretching -You may slowly increase her running pace as her pain allows  You will follow-up in about 6 weeks with Dr. Oneida Alar if pain not getting better.  If you have any further questions, please give the clinic a call [phone].  Cheers,  Elba Barman, DO Sports Medicine Fellow [place]

## 2021-05-30 ENCOUNTER — Ambulatory Visit: Payer: Medicare HMO | Admitting: Sports Medicine

## 2021-05-30 ENCOUNTER — Ambulatory Visit: Payer: Self-pay

## 2021-05-30 VITALS — BP 136/80 | Ht 70.0 in | Wt 190.0 lb

## 2021-05-30 DIAGNOSIS — M25551 Pain in right hip: Secondary | ICD-10-CM | POA: Diagnosis not present

## 2021-05-30 DIAGNOSIS — S76819A Strain of other specified muscles, fascia and tendons at thigh level, unspecified thigh, initial encounter: Secondary | ICD-10-CM | POA: Diagnosis not present

## 2021-05-30 MED ORDER — NITROGLYCERIN 0.2 MG/HR TD PT24: MEDICATED_PATCH | TRANSDERMAL | 1 refills | Status: DC

## 2021-05-30 NOTE — Assessment & Plan Note (Signed)
We will add nitroglycerin patches to see if they will benefit his healing ?Keep up home exercise plan ?Okay to try to run as long as it is pain-free ?Recheck 6 weeks ?

## 2021-05-30 NOTE — Progress Notes (Signed)
PCP: Deland Pretty, MD ? ?Subjective:  ? ?HPI: ?Justin Mcdaniel is a 66 y.o. male here for follow-up of right anterior hip pain. ? ?He was last seen on 04/18/2021 and we did diagnose a prior AIIS proximal avulsion fracture.  He has been [place] quite well at that time, but states about 2-3 weeks after he saw his he had a reaggravation of the right hip pain.  He denies any specific injury or known cause of the aggravation.  He has been doing some of the stretches that we showed him.  At times he is limping in the morning but and this will get better as the day goes on.  He continues to run, he is warming up with walking first and then runs a few miles and during this he actually feels quite well. Not taking any consistent medication for the pain.  His pain continues to be located in the anterior hip, denies any radiating pain into the groin. ? ? ?Past Medical History:  ?Diagnosis Date  ? Pleural effusion   ? ? ?Current Outpatient Medications on File Prior to Visit  ?Medication Sig Dispense Refill  ? nitroGLYCERIN (NITROSTAT) 0.4 MG SL tablet DISSOLVE 1 TABLET UNDER THE TONGUE EVERY 5 MINUTES UP TO 3 TABS FOR CHEST PAIN. CALL 911 AT 15 MINUTES    ? OVER THE COUNTER MEDICATION Take 81 mg by mouth daily. Baby asprin    ? rosuvastatin (CRESTOR) 20 MG tablet Take 20 mg by mouth daily.    ? zolpidem (AMBIEN) 10 MG tablet     ? ?No current facility-administered medications on file prior to visit.  ? ? ?No past surgical history on file. ? ?Allergies  ?Allergen Reactions  ? Levofloxacin   ?  Other reaction(s): Other (See Comments) ?Tendon apathy  ? ? ?BP 136/80   Ht '5\' 10"'$  (1.778 m)   Wt 190 lb (86.2 kg)   BMI 27.26 kg/m?  ? ?[facility] Adult Exercise 04/18/2021  ?Frequency of aerobic exercise (# of days/week) 7  ?Average time in minutes 30  ?Frequency of strengthening activities (# of days/week) 0  ? ? ?No flowsheet data found. ? ?    ?Objective:  ?Physical Exam: ? ?Gen: Well-appearing, in no acute distress;  non-toxic ?CV: Regular Rate. Well-perfused. Warm.  ?Resp: Breathing unlabored on room air; no wheezing. ?Psych: Fluid speech in conversation; appropriate affect; normal thought process ?Neuro: Sensation intact throughout. No gross coordination deficits.  ?MSK:  ?- Right hip: + Mild TTP noted just distal to the AIIS with deep palpation.  No ASIS TTP, there is some greater trochanter TTP.  Inspection yields no erythema, ecchymosis or swelling.  Passive logroll equivalent bilaterally.  Range of motion full in all directions.  There is some mild pain with resisted hip flexion otherwise 5/5 strength intact.  Nonantalgic gait.  Negative FABER/FADIR testing.  Neurovascular intact distally. ? ?MSK Limited anterior hip ultrasound performed, right ?  ?-Long axis evaluation of the ASIS was evaluated without cortical irregularity.  There is appropriate insertion of the sartorius muscle and overlying hip flexors without evidence of tearing or tendinopathic changes. ?-Long and short axis evaluation of the AIIS was seen with again redemonstrated avulsion fracture noted on the distal aspect of the AIIS.  There is tendinopathic changes of the proximal rectus femoris near this insertion point, although no full-thickness tearing noted.  There is a mild degree of hypoechoic change around the avulsion fracture. ?-Evaluation of the hip joint was visualized with small calcification near the  labrum, although femoral head and neck junction appears preserved without any joint effusion noted of the hip. ?-The greater trochanter was evaluated in both short and long axis without notable cortical irregularity.  There is very small bony arthritic change noted over the tubercle.  Gluteus medius and minimus tendons were identified without evidence of tearing or significant tendinopathic changes. ? ?IMPRESSION: Chronic appearing AIIS avulsion fracture with tendinopathic changes at the rectus femoris near the insertion site, mild hypoechoic fluid  surrounding the prior avulsion injury. ? ? Ultrasound and interpretation by Dr. Rolena Infante and [name], MD ? ?Assessment & Plan:  ?1.  Right anterior hip pain ?2.  AIIS proximal avulsion fracture ?3.  Rectus femoris tendinopathy, right  ? ?-Continue the home hip exercises --> hip flexion, standing lunge, step ups, walking up and down steps.  You may gently stretch out the hip flexor with the lunges, be sure you are not really aggravating her pain. ?-We will do a trial of Nitropatch therapy, 1/4 patch rectally over the AIIS painful site.  If he tolerates this for 2 weeks and is not seeing gross improvement in his pain, he may increase to 1/2 patch.  Potential side effects discussed with the patient. ?-We will follow-up in about 6 weeks to see how he is improving.  If the pain is still persistent, we may consider an injection into the area at that time ?-May continue walking/jogging as his pain allows ? ?Elba Barman, DO ?PGY-4, Sports Medicine Fellow ?[place] ? ?I observed and examined the patient with the [facility] resident and agree with assessment and plan.  Note reviewed and modified by me.  ?Ila Mcgill, MD ? ?

## 2021-05-30 NOTE — Patient Instructions (Addendum)
[name], ? ?It was good seeing you again today, I'm hopeful we can get this hip pain taken care of. ? ?Things for you to do: ?-Continue the home hip exercises --> hip flexion, standing lunge, step ups, walking up and down steps.  You may gently stretch out the hip flexor with the lunges, be sure you are not really aggravating her pain. ?-We will do a trial of Nitropatch therapy ?-We will follow-up in about 6 weeks to see if you are improving.  If the pain is still persistent, we may consider an injection at that time. ? ?Nitroglycerin Protocol ? ?Apply 1/4 nitroglycerin patch to affected area daily.  ?If after 2 weeks you are tolerating the patch and not noticing much of an improvement, you may increase to 1/2 patch over the affected area ?Change position of patch within the affected area every 24 hours. ?You may experience a headache during the first 1-2 weeks of using the patch, these should subside. ?If you experience headaches after beginning nitroglycerin patch treatment, you may take your preferred over the counter pain reliever. ?Another side effect of the nitroglycerin patch is skin irritation or rash related to patch adhesive. ?Please notify our office if you develop more severe headaches or rash, and stop the patch. ?Tendon healing with nitroglycerin patch may require 12 to 24 weeks depending on the extent of injury. ?Men should not use if taking Viagra, Cialis, or Levitra.  ?Do not use if you have migraines or rosacea.  ? ? ?If you have any further questions, please give the clinic a call [phone]. ? ?Cheers, ? ?Elba Barman, DO ?[place] ? ?

## 2021-06-30 DIAGNOSIS — Z0000 Encounter for general adult medical examination without abnormal findings: Secondary | ICD-10-CM | POA: Diagnosis not present

## 2021-06-30 DIAGNOSIS — R7309 Other abnormal glucose: Secondary | ICD-10-CM | POA: Diagnosis not present

## 2021-06-30 DIAGNOSIS — Z125 Encounter for screening for malignant neoplasm of prostate: Secondary | ICD-10-CM | POA: Diagnosis not present

## 2021-06-30 DIAGNOSIS — Z13220 Encounter for screening for lipoid disorders: Secondary | ICD-10-CM | POA: Diagnosis not present

## 2021-07-05 DIAGNOSIS — J438 Other emphysema: Secondary | ICD-10-CM | POA: Diagnosis not present

## 2021-07-05 DIAGNOSIS — I2510 Atherosclerotic heart disease of native coronary artery without angina pectoris: Secondary | ICD-10-CM | POA: Diagnosis not present

## 2021-07-05 DIAGNOSIS — Z0000 Encounter for general adult medical examination without abnormal findings: Secondary | ICD-10-CM | POA: Diagnosis not present

## 2021-07-05 DIAGNOSIS — E118 Type 2 diabetes mellitus with unspecified complications: Secondary | ICD-10-CM | POA: Diagnosis not present

## 2021-07-05 DIAGNOSIS — J8410 Pulmonary fibrosis, unspecified: Secondary | ICD-10-CM | POA: Diagnosis not present

## 2021-07-05 DIAGNOSIS — J309 Allergic rhinitis, unspecified: Secondary | ICD-10-CM | POA: Diagnosis not present

## 2021-07-05 DIAGNOSIS — Z86010 Personal history of colonic polyps: Secondary | ICD-10-CM | POA: Diagnosis not present

## 2021-07-05 DIAGNOSIS — R69 Illness, unspecified: Secondary | ICD-10-CM | POA: Diagnosis not present

## 2021-07-05 DIAGNOSIS — Z23 Encounter for immunization: Secondary | ICD-10-CM | POA: Diagnosis not present

## 2021-07-11 ENCOUNTER — Ambulatory Visit: Payer: Medicare HMO | Admitting: Sports Medicine

## 2021-08-22 ENCOUNTER — Ambulatory Visit (INDEPENDENT_AMBULATORY_CARE_PROVIDER_SITE_OTHER): Payer: Medicare HMO | Admitting: Sports Medicine

## 2021-08-22 ENCOUNTER — Ambulatory Visit
Admission: RE | Admit: 2021-08-22 | Discharge: 2021-08-22 | Disposition: A | Discharge status: Home / Self Care | Payer: Medicare HMO | Source: Ambulatory Visit | Attending: Sports Medicine | Admitting: Sports Medicine

## 2021-08-22 VITALS — BP 126/84 | Ht 70.5 in | Wt 190.0 lb

## 2021-08-22 DIAGNOSIS — M25551 Pain in right hip: Secondary | ICD-10-CM

## 2021-08-22 DIAGNOSIS — S32313A Displaced avulsion fracture of unspecified ilium, initial encounter for closed fracture: Secondary | ICD-10-CM | POA: Diagnosis not present

## 2021-08-22 DIAGNOSIS — M1611 Unilateral primary osteoarthritis, right hip: Secondary | ICD-10-CM | POA: Diagnosis not present

## 2021-08-22 NOTE — Progress Notes (Signed)
PCP: Deland Pretty, MD  Subjective:   HPI: Justin Mcdaniel is a pleasant and active 66 y.o. male here for follow-up of right hip pain.  Patient was last seen on 05/30/2021 and at that time we had been working on treatment for a known AIIS proximal avulsion fracture that we saw on ultrasound.  He has been continuing his home exercises and trial of Nitropatch protocol.  He has been up to one half patch.  Unfortunately the Nitropatch in the home exercises have not been improving his pain.  He has been able to run 2-1/2 miles without much pain.  He states his pain is more so when he is doing hip circumduction movements that bring about the pain.  His pain continues on a daily basis and sometimes will get pain at night.  As a reminder, this has been ongoing for 2 years or so.   BP 126/84   Ht 5' 10.5" (1.791 m)   Wt 190 lb (86.2 kg)   BMI 26.88 kg/m      04/18/2021    8:56 AM  [facility]  Frequency of aerobic exercise (# of days/week) 7  Average time in minutes 30  Frequency of strengthening activities (# of days/week) 0        View : No data to display.              Objective:  Physical Exam:  Gen: Well-appearing, in no acute distress; non-toxic CV: Regular Rate. Well-perfused. Warm.  Resp: Breathing unlabored on room air; no wheezing. Psych: Fluid speech in conversation; appropriate affect; normal thought process Neuro: Sensation intact throughout. No gross coordination deficits.  MSK:   - Right hip: + TTP noted over the anterior hip around the AIIS region with deep palpation.  Inspection of the hip demonstrates no erythema, ecchymosis or swelling.  Passive logroll shows some limitation in internal rotation of the right hip compared to the left.  External rotation is preserved bilaterally although there is restriction in internal rotation of only about 8-degrees of the right hip compared to 15-20 degrees of the left.  Strength 5/5 throughout, although some pain  with resisted hip flexion. + Stinchfield test, + FADIR test. Negative FABERE. NVI distally.    Assessment & Plan:  1. Chronic right anterior hip pain -concerning for hip OA versus chronic AIIS proximal avulsion 2.  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  We do know that he has an AIIS proximal avulsion injury, however he is not responding to conservative therapy and I am concerned that this is possibly coming from the intra-articular hip as he does have some limited internal rotation that seems to be provoking his symptoms.  At this point, we will obtain x-rays of the hip to evaluate for any bony pathology, spurring or OA of the hip.  Given the degree of his pain, we will plan to do a cortisone injection, but will let the x-ray guide [place] whether we do this into the hip joint itself or into the AIIS proximal insertion.  We will give him a call once these x-rays result to discuss injection therapy.  Elba Barman, DO PGY-4, Sports Medicine Fellow [place]  This note was dictated using Dragon naturally speaking software and may contain errors in syntax, spelling, or content which have not been identified prior to signing this note.   I observed  and examined the patient with the [facility] resident and agree with assessment and plan.  Note reviewed and modified by me.  Based on XR results we will offer patient directed hip injection. Ila Mcgill, MD

## 2021-08-30 ENCOUNTER — Ambulatory Visit (INDEPENDENT_AMBULATORY_CARE_PROVIDER_SITE_OTHER): Payer: Medicare HMO | Admitting: Sports Medicine

## 2021-08-30 ENCOUNTER — Ambulatory Visit: Payer: Self-pay

## 2021-08-30 VITALS — BP 132/80 | Ht 70.5 in | Wt 190.0 lb

## 2021-08-30 DIAGNOSIS — M25859 Other specified joint disorders, unspecified hip: Secondary | ICD-10-CM

## 2021-08-30 DIAGNOSIS — M25551 Pain in right hip: Secondary | ICD-10-CM | POA: Diagnosis not present

## 2021-08-30 MED ORDER — METHYLPREDNISOLONE ACETATE 40 MG/ML IJ SUSP
40.0000 mg | Freq: Once | INTRAMUSCULAR | Status: AC
  Administered 2021-08-30: 40 mg via INTRA_ARTICULAR

## 2021-08-30 NOTE — Progress Notes (Signed)
PCP: Deland Pretty, MD  Subjective:   HPI: Justin Mcdaniel is a very active 66 y.o. male here for follow-up of right hip.   Justin Mcdaniel last saw Dr. Oneida Alar myself the prior week and we have been treating him for an AIIS avulsion injury, however he was not seeing improvement we would expect.  We did obtain x-rays of the hip following this and he does have some mild degenerative changes of the right hip although more so a cam type lesion and drop osteophyte that I feel are causing his symptoms.  He presents today for trial of ultrasound-guided injection into the hip.  BP 132/80   Ht 5' 10.5" (1.791 m)   Wt 190 lb (86.2 kg)   BMI 26.88 kg/m      04/18/2021    8:56 AM  [place] [facility]  Frequency of aerobic exercise (# of days/week) 7  Average time in minutes 30  Frequency of strengthening activities (# of days/week) 0   DG HIP UNILAT WITH PELVIS 2-3 VIEWS RIGHT CLINICAL DATA:  Hip pain following running, initial encounter  EXAM: DG HIP (WITH OR WITHOUT PELVIS) 2V RIGHT  COMPARISON:  None Available.  FINDINGS: Visualized pelvic ring is intact. Mild prostatic calcifications are seen. Degenerative changes of the right hip joint are noted. 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 No acute fracture is seen.  Electronically Signed   By: Inez Catalina M.D.   On: 08/23/2021 00:01        No data to display              Objective:  Physical Exam:  Gen: Well-appearing, in no acute distress; non-toxic CV: Regular Rate. Well-perfused. Warm.  Resp: Breathing unlabored on room air; no wheezing. Psych: Fluid speech in conversation; appropriate affect; normal thought process Neuro: Sensation intact throughout. No gross coordination deficits.  MSK:  - Right hip: Passive logroll shows some limitation in internal rotation of the right hip compared to the left.  Internal  rotation only about 8-10 degrees compared to 15-20 on the contralateral hip.  Strength 5/5 throughout.  Neurovascular intact distally.  Inspection notes no erythema, ecchymosis or gross joint effusion.   Assessment & Plan:  1.  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  Procedure: Intra-articular hip injection, right After discussion on R/B/I and informed written consent, a timeout was performed. Patient was lying supine on exam table. The hip was cleaned with betadine and alcohol swab. Then utilizing ultrasound guidance, the patient's femoral head and neck junction was identified and subsequently injected with 4:2 lidocaine: depomedrol via an in-plane approach with ultrasound visualization of the injectate administered into the hip joint. Patient tolerated procedure well without immediate complications.  -Through shared decision making, elected proceed with ultrasound-guided injection into the right hip.  Patient tolerated procedure well and did have improvement of pain about 5-10 minutes following injection -Recommended ice for any postinjection pain over the next few days -He may walk but avoid running for the next 48 hours -He will pay attention to see how much improvement he gets from this in the coming days to weeks--this will guide our treatment management to focus on either the intra-articular hip if he finds benefit, or more so for the AIIS avulsion if the injection does not provide him relief  Elba Barman, DO PGY-4, Sports Medicine Fellow [facility]  [place]  This note was dictated using Dragon naturally speaking software and may contain errors in syntax, spelling, or content which have not been identified prior to signing this note.   Addendum:  Patient seen in the office by fellow.  His history, exam, plan of care were precepted with me.  Karlton Lemon MD [place] Boys

## 2021-09-06 ENCOUNTER — Encounter: Payer: Self-pay | Admitting: Sports Medicine

## 2021-10-13 ENCOUNTER — Encounter: Payer: Self-pay | Admitting: Sports Medicine

## 2021-10-18 ENCOUNTER — Ambulatory Visit: Payer: Self-pay

## 2021-10-18 ENCOUNTER — Ambulatory Visit (INDEPENDENT_AMBULATORY_CARE_PROVIDER_SITE_OTHER): Payer: Medicare HMO | Admitting: Family Medicine

## 2021-10-18 VITALS — BP 162/95 | Ht 70.0 in | Wt 185.0 lb

## 2021-10-18 DIAGNOSIS — M25551 Pain in right hip: Secondary | ICD-10-CM

## 2021-10-18 MED ORDER — METHYLPREDNISOLONE ACETATE 40 MG/ML IJ SUSP
40.0000 mg | Freq: Once | INTRAMUSCULAR | Status: AC
Start: 2021-10-18 — End: 2021-10-18
  Administered 2021-10-18: 40 mg via INTRA_ARTICULAR

## 2021-10-18 NOTE — Progress Notes (Signed)
   Established Patient Office Visit  Subjective   Patient ID: Justin Mcdaniel, male    DOB: 11/15/1955  Age: 66 y.o. MRN: [id number]  Mr Viscomi is a 66yo man who presents for follow up on R hip pain.  He underwent intra-articular steroid hip injection 08/30/21 that provided him good relief for approximately 3-1/2 weeks.  His pain gradually returned in the same place as before.  He has been doing some light running however his pain at rest is the same as while running, no increase.  He reports his pain is at its worst it was causing him to limp.  Previous x-rays showed mild degenerative changes of the right hip as well as a cam lesion.  He also has a history of right-sided AIIS avulsion injury.  Patient is here today requesting second injection.  It was discussed with him if intra-articular injection does not alleviate his pain for more than a couple of weeks we may try injection around AIIS.  He denies any numbness, tingling, radiation of pain down his leg or weakness.    Objective:     BP (!) 162/95   Ht '5\' 10"'$  (1.778 m)   Wt 185 lb (83.9 kg)   BMI 26.54 kg/m   Physical Exam Vitals reviewed.  Constitutional:      General: He is not in acute distress.    Appearance: Normal appearance. He is not ill-appearing, toxic-appearing or diaphoretic.  Pulmonary:     Effort: Pulmonary effort is normal.  Neurological:     Mental Status: He is alert.   Right hip: No obvious deformity or asymmetry.  No ecchymosis or swelling.  Tenderness to palpation over the AIIS and greater trochanter.  Decreased range of motion with internal rotation and discomfort.  Slight decrease in range of motion with external rotation as well.  Strength 5/5 hip flexion.  Pain with resisted straight leg raise.  Negative logroll test.  Normal gait.  Sensation intact.   Procedure: AIIS injection, right shoulder  After discussion on risks, benefits, and indications, informed written consent was obtained. A timeout was then performed.  Patient was lying supine on exam table. The patient's right hip was prepped with alcohol swabs and utilizing anterior approach with ultrasound guidance, the patient's AIIS was injected with 3:1 mixture of lidocaine:depomedrol '40mg'$  via an in-plane approach. Patient tolerated the procedure well without immediate complications.     Assessment & Plan:   Problem List Items Addressed This Visit       Other   Right hip pain - Primary    Injection around AIIS, as detailed above performed today under ultrasound guidance.  Patient reports only 3-1/2 weeks of relief with intra-articular hip injection.  His discomfort may be coming from an old avulsion fracture, scar tissue.  Since the first injection did not provide as much relief as anticipated we will trial diagnostic injection around AIIS.  If pain has not resolved we may need to consider further imaging studies such as an MRI. Discussed with patient discontinuation of running for a week as this injection could place him at an increased risk for tendon rupture.  He verbalized understanding. Return to clinic if symptoms worsen or fail to improve.       Relevant Orders   [place] LIMITED JOINT SPACE STRUCTURES LOW RIGHT    Return if symptoms worsen or fail to improve.    Elmore Guise, DO

## 2021-10-18 NOTE — Assessment & Plan Note (Signed)
Injection around AIIS, as detailed above performed today under ultrasound guidance.  Patient reports only 3-1/2 weeks of relief with intra-articular hip injection.  His discomfort may be coming from an old avulsion fracture, scar tissue.  Since the first injection did not provide as much relief as anticipated we will trial diagnostic injection around AIIS.  If pain has not resolved we may need to consider further imaging studies such as an MRI. Discussed with patient discontinuation of running for a week as this injection could place him at an increased risk for tendon rupture.  He verbalized understanding. Return to clinic if symptoms worsen or fail to improve.

## 2021-10-20 ENCOUNTER — Encounter: Payer: Self-pay | Admitting: Family Medicine

## 2021-10-23 ENCOUNTER — Encounter: Payer: Self-pay | Admitting: Family Medicine

## 2021-10-24 DIAGNOSIS — I2510 Atherosclerotic heart disease of native coronary artery without angina pectoris: Secondary | ICD-10-CM | POA: Diagnosis not present

## 2021-10-24 DIAGNOSIS — E1169 Type 2 diabetes mellitus with other specified complication: Secondary | ICD-10-CM | POA: Diagnosis not present

## 2021-10-24 DIAGNOSIS — E7800 Pure hypercholesterolemia, unspecified: Secondary | ICD-10-CM | POA: Diagnosis not present

## 2021-10-24 DIAGNOSIS — E118 Type 2 diabetes mellitus with unspecified complications: Secondary | ICD-10-CM | POA: Diagnosis not present

## 2021-10-24 DIAGNOSIS — R030 Elevated blood-pressure reading, without diagnosis of hypertension: Secondary | ICD-10-CM | POA: Diagnosis not present

## 2021-10-26 NOTE — Addendum Note (Signed)
Addended by: Cyd Silence on: 10/26/2021 10:23 AM   Modules accepted: Orders

## 2021-11-04 ENCOUNTER — Ambulatory Visit
Admission: RE | Admit: 2021-11-04 | Discharge: 2021-11-04 | Disposition: A | Discharge status: Home / Self Care | Payer: Medicare HMO | Source: Ambulatory Visit | Attending: Family Medicine | Admitting: Family Medicine

## 2021-11-04 DIAGNOSIS — M25551 Pain in right hip: Secondary | ICD-10-CM | POA: Diagnosis not present

## 2021-11-07 ENCOUNTER — Encounter: Payer: Self-pay | Admitting: Family Medicine

## 2021-11-09 ENCOUNTER — Other Ambulatory Visit: Payer: Self-pay | Admitting: *Deleted

## 2021-11-09 DIAGNOSIS — M25551 Pain in right hip: Secondary | ICD-10-CM

## 2021-11-29 ENCOUNTER — Ambulatory Visit: Payer: Medicare HMO | Admitting: Orthopaedic Surgery

## 2021-11-29 ENCOUNTER — Encounter: Payer: Self-pay | Admitting: Orthopaedic Surgery

## 2021-11-29 VITALS — Ht 70.0 in | Wt 185.0 lb

## 2021-11-29 DIAGNOSIS — M1611 Unilateral primary osteoarthritis, right hip: Secondary | ICD-10-CM | POA: Diagnosis not present

## 2021-11-29 DIAGNOSIS — M25551 Pain in right hip: Secondary | ICD-10-CM | POA: Diagnosis not present

## 2021-11-29 NOTE — Progress Notes (Signed)
The patient is a very pleasant and active 66 year old gentleman sent to me from Dr. Karlton Lemon to evaluate and treat known severe osteoarthritis of the right hip.  He has been an avid runner all of his life.  He is originally from [place].  He has been dealing with right hip pain for several years now that has gotten worse over the last 12 months.  He has worked on activity modification.  He has performed a home exercise program with hip strengthening.  He has had at least 2 steroid injections in the right hip.  His x-rays showed significant arthritic changes with superior lateral joint space narrowing as well as osteophytes and sclerotic changes around the femoral head.  I have plain films and also an MRI that he was sent for to evaluate his right hip further.  At this point his pain can be 10 out of 10.  His hip gets very stiff and has been sitting and goes to stand up.  At this point his right hip pain is detrimentally affecting his mobility, his quality of life and his actives daily living.  He is a very healthy 66 year old gentleman.  He is not a diabetic.  He is thin.  He is not on blood thinning medications.  He is also try to offload his hip is much as he can on the right side.  On exam his right hip is significantly stiff with internal and external rotation and so severe pain in the groin with attempts of rotation.  His left hip exam is normal.  I did review the plain films with him and especially MRI of his right hip.  The MRI of the right hip shows severe osteoarthritis.  There is extensive full-thickness cartilage loss of the femoral head and acetabulum with subchondral reactive edema which is quite extensive in the femoral head.  There is 11 mm loose body along the anterior aspect of the right hip joint space.  There is severe degeneration of the right superior labrum with maceration of the anterior labrum.  I have recommended a total hip arthroplasty at this point for his right hip given the  failure of conservative treatment for over 12 months now.  Also his MRI findings show how severe his osteoarthritis is.  At this point further treatment including physical therapy would be of no use medically at all.  His arthritis is that severe in terms of the findings on plain film and MRI combined with clinical exam findings.  I showed him a hip replacement model and explained in detail what the surgery involves.  We discussed the risks and benefits of surgery and what to expect from an intraoperative and postoperative course.  All questions and concerns were answered and addressed.  We will work on getting this scheduled.

## 2021-12-07 ENCOUNTER — Other Ambulatory Visit: Payer: Self-pay

## 2021-12-12 ENCOUNTER — Other Ambulatory Visit: Payer: Self-pay | Admitting: Orthopaedic Surgery

## 2021-12-12 ENCOUNTER — Telehealth: Payer: Self-pay | Admitting: Orthopaedic Surgery

## 2021-12-12 MED ORDER — CELECOXIB 200 MG PO CAPS: 200.0000 mg | ORAL_CAPSULE | Freq: Two times a day (BID) | ORAL | 1 refills | Status: DC | PRN

## 2021-12-12 NOTE — Telephone Encounter (Signed)
Pt's wife  Thayer Headings called requesting pain medication for pt. She states pt is in severe pains and not due for surgery until end of Oct and need something to help with pain. Please send to [facility]. Please call t when meds sent in. Pt phone number is [phone]

## 2021-12-12 NOTE — Telephone Encounter (Signed)
Please advise 

## 2021-12-12 NOTE — Telephone Encounter (Signed)
Called and advised to alternate this with tylenol arthritis. He stated he would try it

## 2021-12-14 ENCOUNTER — Other Ambulatory Visit: Payer: Self-pay | Admitting: Physician Assistant

## 2021-12-26 NOTE — Progress Notes (Signed)
COVID Vaccine Completed: yes  Date of COVID positive in last 90 days:  PCP - Deland Pretty, MD Cardiologist -   Chest x-ray -  EKG -  Stress Test - 09/16/19 Epic ECHO - 07/27/19 Epic Cardiac Cath -  Pacemaker/ICD device last checked: Spinal Cord Stimulator:  Bowel Prep -   Sleep Study -  CPAP -   Fasting Blood Sugar -  Checks Blood Sugar _____ times a day  Blood Thinner Instructions: Aspirin Instructions: Last Dose:  Activity level:  Can go up a flight of stairs and perform activities of daily living without stopping and without symptoms of chest pain or shortness of breath.  Able to exercise without symptoms  Unable to go up a flight of stairs without symptoms of     Anesthesia review:   Patient denies shortness of breath, fever, cough and chest pain at PAT appointment  Patient verbalized understanding of instructions that were given to them at the PAT appointment. Patient was also instructed that they will need to review over the PAT instructions again at home before surgery.

## 2021-12-26 NOTE — Patient Instructions (Signed)
SURGICAL WAITING ROOM VISITATION Patients having surgery or a procedure may have no more than 2 support people in the waiting area - these visitors may rotate.   Children under the age of 29 must have an adult with them who is not the patient. If the patient needs to stay at the hospital during part of their recovery, the visitor guidelines for inpatient rooms apply. Pre-op nurse will coordinate an appropriate time for 1 support person to accompany patient in pre-op.  This support person may not rotate.    Please refer to the [facility] website for the visitor guidelines for Inpatients (after your surgery is over and you are in a regular room).    Your procedure is scheduled on: 01/05/22   Report to [facility] Main Entrance    Report to admitting at 7:15 AM   Call this number if you have problems the morning of surgery [phone]   Do not eat food :After Midnight.   After Midnight you may have the following liquids until 6:45 AM DAY OF SURGERY  Water Non-Citrus Juices (without pulp, NO RED) Carbonated Beverages Black Coffee (NO MILK/CREAM OR CREAMERS, sugar ok)  Clear Tea (NO MILK/CREAM OR CREAMERS, sugar ok) regular and decaf                             Plain Jell-O (NO RED)                                           Fruit ices (not with fruit pulp, NO RED)                                     Popsicles (NO RED)                                                               Sports drinks like Gatorade (NO RED)               The day of surgery:  Drink ONE (1) Pre-Surgery Clear Ensure at 6:45 AM the morning of surgery. Drink in one sitting. Do not sip.  This drink was given to you during your hospital  pre-op appointment visit. Nothing else to drink after completing the  Pre-Surgery Clear Ensure.          If you have questions, please contact your surgeon's office.   FOLLOW BOWEL PREP AND ANY ADDITIONAL PRE OP INSTRUCTIONS YOU RECEIVED FROM YOUR SURGEON'S OFFICE!!!      Oral Hygiene is also important to reduce your risk of infection.                                    Remember - BRUSH YOUR TEETH THE MORNING OF SURGERY WITH YOUR REGULAR TOOTHPASTE   Take these medicines the morning of surgery with A SIP OF WATER: Tylenol, Rosuvastatin  You may not have any metal on your body including jewelry, and body piercing             Do not wear lotions, powders, cologne, or deodorant              Men may shave face and neck.   Do not bring valuables to the hospital. [place].   Contacts, dentures or bridgework may not be worn into surgery.   Bring small overnight bag day of surgery.   DO NOT [place]. PHARMACY WILL DISPENSE MEDICATIONS LISTED ON YOUR MEDICATION LIST TO YOU DURING YOUR ADMISSION [place]!               Please read over the following fact sheets you were given: IF YOU HAVE QUESTIONS ABOUT YOUR PRE-OP INSTRUCTIONS PLEASE CALL [phone]- Lenise Jr   If you received a COVID test during your pre-op visit  it is requested that you wear a mask when out in public, stay away from anyone that may not be feeling well and notify your surgeon if you develop symptoms. If you test positive for Covid or have been in contact with anyone that has tested positive in the last 10 days please notify you surgeon.     [place] - Preparing for Surgery Before surgery, you can play an important role.  Because skin is not sterile, your skin needs to be as free of germs as possible.  You can reduce the number of germs on your skin by washing with CHG (chlorahexidine gluconate) soap before surgery.  CHG is an antiseptic cleaner which kills germs and bonds with the skin to continue killing germs even after washing. Please DO NOT use if you have an allergy to CHG or antibacterial soaps.  If your skin becomes reddened/irritated stop using the CHG and  inform your nurse when you arrive at Short Stay. Do not shave (including legs and underarms) for at least 48 hours prior to the first CHG shower.  You may shave your face/neck.  Please follow these instructions carefully:  1.  Shower with CHG Soap the night before surgery and the  morning of surgery.  2.  If you choose to wash your hair, wash your hair first as usual with your normal  shampoo.  3.  After you shampoo, rinse your hair and body thoroughly to remove the shampoo.                             4.  Use CHG as you would any other liquid soap.  You can apply chg directly to the skin and wash.  Gently with a scrungie or clean washcloth.  5.  Apply the CHG Soap to your body ONLY FROM THE NECK DOWN.   Do   not use on face/ open                           Wound or open sores. Avoid contact with eyes, ears mouth and   genitals (private parts).                       Wash face,  Genitals (private parts) with your normal soap.  6.  Wash thoroughly, paying special attention to the area where your    surgery  will be performed.  7.  Thoroughly rinse your body with warm water from the neck down.  8.  DO NOT shower/wash with your normal soap after using and rinsing off the CHG Soap.                9.  Pat yourself dry with a clean towel.            10.  Wear clean pajamas.            11.  Place clean sheets on your bed the night of your first shower and do not  sleep with pets. Day of Surgery : Do not apply any lotions/deodorants the morning of surgery.  Please wear clean clothes to the hospital/surgery center.  FAILURE TO FOLLOW THESE INSTRUCTIONS MAY RESULT IN THE CANCELLATION OF YOUR SURGERY  PATIENT SIGNATURE_________________________________  NURSE SIGNATURE__________________________________  ________________________________________________________________________   Adam Phenix  An incentive spirometer is a tool that can help keep your lungs clear and active. This tool  measures how well you are filling your lungs with each breath. Taking long deep breaths may help reverse or decrease the chance of developing breathing (pulmonary) problems (especially infection) following: A long period of time when you are unable to move or be active. BEFORE THE PROCEDURE  If the spirometer includes an indicator to show your best effort, your nurse or respiratory therapist will set it to a desired goal. If possible, sit up straight or lean slightly forward. Try not to slouch. Hold the incentive spirometer in an upright position. INSTRUCTIONS FOR USE  Sit on the edge of your bed if possible, or sit up as far as you can in bed or on a chair. Hold the incentive spirometer in an upright position. Breathe out normally. Place the mouthpiece in your mouth and seal your lips tightly around it. Breathe in slowly and as deeply as possible, raising the piston or the ball toward the top of the column. Hold your breath for 3-5 seconds or for as long as possible. Allow the piston or ball to fall to the bottom of the column. Remove the mouthpiece from your mouth and breathe out normally. Rest for a few seconds and repeat Steps 1 through 7 at least 10 times every 1-2 hours when you are awake. Take your time and take a few normal breaths between deep breaths. The spirometer may include an indicator to show your best effort. Use the indicator as a goal to work toward during each repetition. After each set of 10 deep breaths, practice coughing to be sure your lungs are clear. If you have an incision (the cut made at the time of surgery), support your incision when coughing by placing a pillow or rolled up towels firmly against it. Once you are able to get out of bed, walk around indoors and cough well. You may stop using the incentive spirometer when instructed by your caregiver.  RISKS AND COMPLICATIONS Take your time so you do not get dizzy or light-headed. If you are in pain, you may need to  take or ask for pain medication before doing incentive spirometry. It is harder to take a deep breath if you are having pain. AFTER USE Rest and breathe slowly and easily. It can be helpful to keep track of a log of your progress. Your caregiver can provide you with a simple table to help with this.  If you are using the spirometer at home, follow these instructions: [place] IF:  You are having difficultly using the spirometer. You have trouble using the spirometer as often as instructed. Your pain medication is not giving enough relief while using the spirometer. You develop fever of 100.5 F (38.1 C) or higher. SEEK IMMEDIATE MEDICAL CARE IF:  You cough up bloody sputum that had not been present before. You develop fever of 102 F (38.9 C) or greater. You develop worsening pain at or near the incision site. MAKE SURE YOU:  Understand these instructions. Will watch your condition. Will get help right away if you are not doing well or get worse. Document Released: 07/16/2006 Document Revised: 05/28/2011 Document Reviewed: 09/16/2006 ExitCare Patient Information 2014 ExitCare, [place].   ________________________________________________________________________  WHAT IS A BLOOD TRANSFUSION? Blood Transfusion Information  A transfusion is the replacement of blood or some of its parts. Blood is made up of multiple cells which provide different functions. Red blood cells carry oxygen and are used for blood loss replacement. White blood cells fight against infection. Platelets control bleeding. Plasma helps clot blood. Other blood products are available for specialized needs, such as hemophilia or other clotting disorders. BEFORE THE TRANSFUSION  Who gives blood for transfusions?  Healthy volunteers who are fully evaluated to make sure their blood is safe. This is blood bank blood. Transfusion therapy is the safest it has ever been in the practice of medicine. Before blood is  taken from a donor, a complete history is taken to make sure that person has no history of diseases nor engages in risky social behavior (examples are intravenous drug use or sexual activity with multiple partners). The donor's travel history is screened to minimize risk of transmitting infections, such as malaria. The donated blood is tested for signs of infectious diseases, such as HIV and hepatitis. The blood is then tested to be sure it is compatible with you in order to minimize the chance of a transfusion reaction. If you or a relative donates blood, this is often done in anticipation of surgery and is not appropriate for emergency situations. It takes many days to process the donated blood. RISKS AND COMPLICATIONS Although transfusion therapy is very safe and saves many lives, the main dangers of transfusion include:  Getting an infectious disease. Developing a transfusion reaction. This is an allergic reaction to something in the blood you were given. Every precaution is taken to prevent this. The decision to have a blood transfusion has been considered carefully by your caregiver before blood is given. Blood is not given unless the benefits outweigh the risks. AFTER THE TRANSFUSION Right after receiving a blood transfusion, you will usually feel much better and more energetic. This is especially true if your red blood cells have gotten low (anemic). The transfusion raises the level of the red blood cells which carry oxygen, and this usually causes an energy increase. The nurse administering the transfusion will monitor you carefully for complications. HOME CARE INSTRUCTIONS  No special instructions are needed after a transfusion. You may find your energy is better. Speak with your caregiver about any limitations on activity for underlying diseases you may have. SEEK MEDICAL CARE IF:  Your condition is not improving after your transfusion. You develop redness or irritation at the intravenous  (IV) site. SEEK IMMEDIATE MEDICAL CARE IF:  Any of the following symptoms occur over the next 12 hours: Shaking chills. You have a temperature by mouth above 102 F (38.9 C),  not controlled by medicine. Chest, back, or muscle pain. People around you feel you are not acting correctly or are confused. Shortness of breath or difficulty breathing. Dizziness and fainting. You get a rash or develop hives. You have a decrease in urine output. Your urine turns a dark color or changes to pink, red, or brown. Any of the following symptoms occur over the next 10 days: You have a temperature by mouth above 102 F (38.9 C), not controlled by medicine. Shortness of breath. Weakness after normal activity. The white part of the eye turns yellow (jaundice). You have a decrease in the amount of urine or are urinating less often. Your urine turns a dark color or changes to pink, red, or brown. Document Released: 03/02/2000 Document Revised: 05/28/2011 Document Reviewed: 10/20/2007 [facility] Patient Information 2014 [place], [place].  _______________________________________________________________________

## 2021-12-27 ENCOUNTER — Encounter (HOSPITAL_COMMUNITY): Payer: Self-pay

## 2021-12-27 ENCOUNTER — Encounter (HOSPITAL_COMMUNITY)
Admission: RE | Admit: 2021-12-27 | Discharge: 2021-12-27 | Disposition: A | Discharge status: Home / Self Care | Payer: Medicare HMO | Source: Ambulatory Visit | Attending: Orthopaedic Surgery | Admitting: Orthopaedic Surgery

## 2021-12-27 VITALS — BP 173/109 | HR 74 | Temp 98.3°F | Resp 16 | Ht 70.5 in | Wt 197.0 lb

## 2021-12-27 DIAGNOSIS — R7303 Prediabetes: Secondary | ICD-10-CM | POA: Insufficient documentation

## 2021-12-27 DIAGNOSIS — Z01818 Encounter for other preprocedural examination: Secondary | ICD-10-CM | POA: Insufficient documentation

## 2021-12-27 HISTORY — DX: Unspecified osteoarthritis, unspecified site: M19.90

## 2021-12-27 HISTORY — DX: Prediabetes: R73.03

## 2021-12-27 LAB — CBC
HCT: 42 % (ref 39.0–52.0)
Hemoglobin: 14.5 g/dL (ref 13.0–17.0)
MCH: 31.2 pg (ref 26.0–34.0)
MCHC: 34.5 g/dL (ref 30.0–36.0)
MCV: 90.3 fL (ref 80.0–100.0)
Platelets: 281 10*3/uL (ref 150–400)
RBC: 4.65 MIL/uL (ref 4.22–5.81)
RDW: 11.9 % (ref 11.5–15.5)
WBC: 9 10*3/uL (ref 4.0–10.5)
nRBC: 0 % (ref 0.0–0.2)

## 2021-12-27 LAB — GLUCOSE, CAPILLARY: Glucose-Capillary: 131 mg/dL — ABNORMAL HIGH (ref 70–99)

## 2021-12-27 LAB — HEMOGLOBIN A1C
Hgb A1c MFr Bld: 5.8 % — ABNORMAL HIGH (ref 4.8–5.6)
Mean Plasma Glucose: 119.76 mg/dL

## 2021-12-27 LAB — SURGICAL PCR SCREEN
MRSA, PCR: NEGATIVE
Staphylococcus aureus: POSITIVE — AB

## 2021-12-28 DIAGNOSIS — I2510 Atherosclerotic heart disease of native coronary artery without angina pectoris: Secondary | ICD-10-CM | POA: Diagnosis not present

## 2021-12-28 DIAGNOSIS — I10 Essential (primary) hypertension: Secondary | ICD-10-CM | POA: Diagnosis not present

## 2021-12-28 DIAGNOSIS — E1169 Type 2 diabetes mellitus with other specified complication: Secondary | ICD-10-CM | POA: Diagnosis not present

## 2021-12-28 DIAGNOSIS — E7800 Pure hypercholesterolemia, unspecified: Secondary | ICD-10-CM | POA: Diagnosis not present

## 2021-12-28 NOTE — Progress Notes (Signed)
Patient called stating he saw his PCP today and he started him on amlodipine. Instructed pt he can take that morning of his surgery

## 2022-01-04 DIAGNOSIS — M1611 Unilateral primary osteoarthritis, right hip: Secondary | ICD-10-CM

## 2022-01-04 NOTE — H&P (Signed)
TOTAL HIP ADMISSION H&P  Patient is admitted for right total hip arthroplasty.  Subjective:  Chief Complaint: right hip pain  HPI: Justin Mcdaniel, 66 y.o. male, has a history of pain and functional disability in the right hip(s) due to arthritis and patient has failed non-surgical conservative treatments for greater than 12 weeks to include NSAID's and/or analgesics, corticosteriod injections, flexibility and strengthening excercises, and activity modification.  Onset of symptoms was gradual starting 3 years ago with gradually worsening course since that time.The patient noted no past surgery on the right hip(s).  Patient currently rates pain in the right hip at 10 out of 10 with activity. Patient has night pain, worsening of pain with activity and weight bearing, pain that interfers with activities of daily living, and pain with passive range of motion. Patient has evidence of subchondral cysts, subchondral sclerosis, periarticular osteophytes, and joint space narrowing by imaging studies. This condition presents safety issues increasing the risk of falls.  There is no current active infection.  Patient Active Problem List   Diagnosis Date Noted   Unilateral primary osteoarthritis, right hip 01/04/2022   Right hip pain 10/18/2021   Strain of rectus femoris muscle 05/30/2021   Hyperlipidemia 09/04/2019   Family history of heart disease 09/04/2019   Elevated coronary artery calcium score 09/04/2019   Abnormality of gait 06/07/2015   CALF PAIN, LEFT 12/21/2009   SCIATICA, RIGHT 10/21/2008   LOW BACK PAIN, CHRONIC 11/12/2006   DISLOCATION CLOSED FINGER NOS 11/12/2006   Past Medical History:  Diagnosis Date   Arthritis    Pleural effusion    Pre-diabetes     Past Surgical History:  Procedure Laterality Date   COLONOSCOPY     multiple per pt   TONSILLECTOMY     WISDOM TOOTH EXTRACTION      No current facility-administered medications for this encounter.   Current Outpatient  Medications  Medication Sig Dispense Refill Last Dose   acetaminophen (TYLENOL) 650 MG CR tablet Take 1,300 mg by mouth 2 (two) times daily.      aspirin EC 81 MG tablet Take 81 mg by mouth daily. Swallow whole.      celecoxib (CELEBREX) 200 MG capsule Take 1 capsule (200 mg total) by mouth 2 (two) times daily between meals as needed. 60 capsule 1    metFORMIN (GLUCOPHAGE-XR) 750 MG 24 hr tablet Take 750 mg by mouth 2 (two) times daily.      nitroGLYCERIN (NITROSTAT) 0.4 MG SL tablet Place 0.4 mg under the tongue every 5 (five) minutes as needed for chest pain.      rosuvastatin (CRESTOR) 20 MG tablet Take 20 mg by mouth daily.      zolpidem (AMBIEN) 10 MG tablet Take 5-10 mg by mouth at bedtime as needed for sleep.      nitroGLYCERIN (NITRODUR - DOSED IN MG/24 HR) 0.2 mg/hr patch Use 1/4 patch daily to the affected area. (Patient not taking: Reported on 12/25/2021) 30 patch 1 Not Taking   No Known Allergies  Social History   Tobacco Use   Smoking status: Former    Packs/day: 1.50    Years: 20.00    Total pack years: 30.00    Types: Cigarettes    Quit date: 08/07/2010    Years since quitting: 11.4   Smokeless tobacco: Never  Substance Use Topics   Alcohol use: Yes    Alcohol/week: 2.0 standard drinks of alcohol    Types: 2 Cans of beer per week    Family  History  Problem Relation Age of Onset   CAD Mother    CAD Father    CAD Brother      Review of Systems  All other systems reviewed and are negative.   Objective:  Physical Exam Vitals reviewed.  Constitutional:      Appearance: Normal appearance. He is normal weight.  HENT:     Head: Normocephalic and atraumatic.  Eyes:     Extraocular Movements: Extraocular movements intact.     Pupils: Pupils are equal, round, and reactive to light.  Cardiovascular:     Rate and Rhythm: Normal rate and regular rhythm.  Pulmonary:     Effort: Pulmonary effort is normal.     Breath sounds: Normal breath sounds.  Abdominal:      Palpations: Abdomen is soft.  Musculoskeletal:     Cervical back: Normal range of motion and neck supple.     Right hip: Tenderness and bony tenderness present. Decreased range of motion. Decreased strength.  Neurological:     Mental Status: He is alert and oriented to person, place, and time.  Psychiatric:        Behavior: Behavior normal.     Vital signs in last 24 hours:    Labs:   Estimated body mass index is 27.87 kg/m as calculated from the following:   Height as of 12/27/21: 5' 10.5" (1.791 m).   Weight as of 12/27/21: 89.4 kg.   Imaging Review Plain radiographs demonstrate severe degenerative joint disease of the right hip(s). The bone quality appears to be excellent for age and reported activity level.      Assessment/Plan:  End stage arthritis, right hip(s)  The patient history, physical examination, clinical judgement of the provider and imaging studies are consistent with end stage degenerative joint disease of the right hip(s) and total hip arthroplasty is deemed medically necessary. The treatment options including medical management, injection therapy, arthroscopy and arthroplasty were discussed at length. The risks and benefits of total hip arthroplasty were presented and reviewed. The risks due to aseptic loosening, infection, stiffness, dislocation/subluxation,  thromboembolic complications and other imponderables were discussed.  The patient acknowledged the explanation, agreed to proceed with the plan and consent was signed. Patient is being admitted for inpatient treatment for surgery, pain control, PT, OT, prophylactic antibiotics, VTE prophylaxis, progressive ambulation and ADL's and discharge planning.The patient is planning to be discharged home with home health services

## 2022-01-05 ENCOUNTER — Encounter (HOSPITAL_COMMUNITY): Payer: Self-pay | Admitting: Orthopaedic Surgery

## 2022-01-05 ENCOUNTER — Other Ambulatory Visit: Payer: Self-pay

## 2022-01-05 ENCOUNTER — Ambulatory Visit (HOSPITAL_COMMUNITY): Payer: Medicare HMO

## 2022-01-05 ENCOUNTER — Ambulatory Visit (HOSPITAL_COMMUNITY): Payer: Medicare HMO | Admitting: Physician Assistant

## 2022-01-05 ENCOUNTER — Ambulatory Visit (HOSPITAL_BASED_OUTPATIENT_CLINIC_OR_DEPARTMENT_OTHER): Payer: Medicare HMO | Admitting: Anesthesiology

## 2022-01-05 ENCOUNTER — Observation Stay (HOSPITAL_COMMUNITY): Payer: Medicare HMO

## 2022-01-05 ENCOUNTER — Encounter (HOSPITAL_COMMUNITY)
Admission: RE | Disposition: A | Discharge status: Home Health | Payer: Self-pay | Source: Ambulatory Visit | Attending: Orthopaedic Surgery

## 2022-01-05 ENCOUNTER — Observation Stay (HOSPITAL_COMMUNITY)
Admission: RE | Admit: 2022-01-05 | Discharge: 2022-01-06 | Disposition: A | Discharge status: Home Health | Payer: Medicare HMO | Source: Ambulatory Visit | Attending: Orthopaedic Surgery | Admitting: Orthopaedic Surgery

## 2022-01-05 DIAGNOSIS — Z7982 Long term (current) use of aspirin: Secondary | ICD-10-CM | POA: Diagnosis not present

## 2022-01-05 DIAGNOSIS — Z96641 Presence of right artificial hip joint: Secondary | ICD-10-CM

## 2022-01-05 DIAGNOSIS — Z79899 Other long term (current) drug therapy: Secondary | ICD-10-CM | POA: Insufficient documentation

## 2022-01-05 DIAGNOSIS — Z87891 Personal history of nicotine dependence: Secondary | ICD-10-CM | POA: Insufficient documentation

## 2022-01-05 DIAGNOSIS — Z7984 Long term (current) use of oral hypoglycemic drugs: Secondary | ICD-10-CM

## 2022-01-05 DIAGNOSIS — E119 Type 2 diabetes mellitus without complications: Secondary | ICD-10-CM

## 2022-01-05 DIAGNOSIS — M1611 Unilateral primary osteoarthritis, right hip: Secondary | ICD-10-CM | POA: Diagnosis not present

## 2022-01-05 DIAGNOSIS — Z471 Aftercare following joint replacement surgery: Secondary | ICD-10-CM | POA: Diagnosis not present

## 2022-01-05 DIAGNOSIS — I2510 Atherosclerotic heart disease of native coronary artery without angina pectoris: Secondary | ICD-10-CM | POA: Diagnosis not present

## 2022-01-05 DIAGNOSIS — R600 Localized edema: Secondary | ICD-10-CM | POA: Diagnosis not present

## 2022-01-05 DIAGNOSIS — Z9889 Other specified postprocedural states: Secondary | ICD-10-CM | POA: Diagnosis not present

## 2022-01-05 DIAGNOSIS — M6281 Muscle weakness (generalized): Secondary | ICD-10-CM | POA: Diagnosis not present

## 2022-01-05 HISTORY — PX: TOTAL HIP ARTHROPLASTY: SHX124

## 2022-01-05 LAB — GLUCOSE, CAPILLARY
Glucose-Capillary: 257 mg/dL — ABNORMAL HIGH (ref 70–99)
Glucose-Capillary: 163 mg/dL — ABNORMAL HIGH (ref 70–99)

## 2022-01-05 LAB — TYPE AND SCREEN
ABO/RH(D): A POS
Antibody Screen: NEGATIVE

## 2022-01-05 LAB — ABO/RH: ABO/RH(D): A POS

## 2022-01-05 SURGERY — ARTHROPLASTY, HIP, TOTAL, ANTERIOR APPROACH
Anesthesia: Spinal | Site: Hip | Laterality: Right

## 2022-01-05 MED ORDER — ONDANSETRON HCL 4 MG PO TABS: 4.0000 mg | ORAL_TABLET | Freq: Four times a day (QID) | ORAL | Status: DC | PRN

## 2022-01-05 MED ORDER — FENTANYL CITRATE (PF) 100 MCG/2ML IJ SOLN
INTRAMUSCULAR | Status: DC | PRN
  Administered 2022-01-05: 100 ug via INTRAVENOUS

## 2022-01-05 MED ORDER — PROMETHAZINE HCL 25 MG/ML IJ SOLN
6.2500 mg | INTRAMUSCULAR | Status: DC | PRN
  Administered 2022-01-05: 6.25 mg via INTRAVENOUS

## 2022-01-05 MED ORDER — ZOLPIDEM TARTRATE 5 MG PO TABS: 5.0000 mg | ORAL_TABLET | Freq: Every evening | ORAL | Status: DC | PRN

## 2022-01-05 MED ORDER — ASPIRIN 81 MG PO CHEW
81.0000 mg | CHEWABLE_TABLET | Freq: Two times a day (BID) | ORAL | Status: DC
  Administered 2022-01-05 – 2022-01-06 (×2): 81 mg via ORAL
  Filled 2022-01-05 (×2): qty 1

## 2022-01-05 MED ORDER — OXYCODONE HCL 5 MG PO TABS
5.0000 mg | ORAL_TABLET | Freq: Once | ORAL | Status: AC | PRN
  Administered 2022-01-05: 5 mg via ORAL

## 2022-01-05 MED ORDER — INSULIN ASPART 100 UNIT/ML IJ SOLN
10.0000 [IU] | Freq: Once | INTRAMUSCULAR | Status: AC
  Administered 2022-01-05: 10 [IU] via SUBCUTANEOUS

## 2022-01-05 MED ORDER — CEFAZOLIN SODIUM-DEXTROSE 1-4 GM/50ML-% IV SOLN
1.0000 g | Freq: Four times a day (QID) | INTRAVENOUS | Status: AC
  Administered 2022-01-05 (×2): 1 g via INTRAVENOUS
  Filled 2022-01-05 (×2): qty 50

## 2022-01-05 MED ORDER — PROMETHAZINE HCL 25 MG/ML IJ SOLN
INTRAMUSCULAR | Status: AC
  Filled 2022-01-05: qty 1

## 2022-01-05 MED ORDER — BUPIVACAINE IN DEXTROSE 0.75-8.25 % IT SOLN
INTRATHECAL | Status: DC | PRN
  Administered 2022-01-05: 2 mL via INTRATHECAL

## 2022-01-05 MED ORDER — OXYCODONE HCL 5 MG PO TABS
ORAL_TABLET | ORAL | Status: AC
  Filled 2022-01-05: qty 1

## 2022-01-05 MED ORDER — METOCLOPRAMIDE HCL 5 MG PO TABS: 5.0000 mg | ORAL_TABLET | Freq: Three times a day (TID) | ORAL | Status: DC | PRN

## 2022-01-05 MED ORDER — OXYCODONE HCL 5 MG/5ML PO SOLN: 5.0000 mg | Freq: Once | ORAL | Status: AC | PRN

## 2022-01-05 MED ORDER — METHOCARBAMOL 500 MG IVPB - SIMPLE MED: 500.0000 mg | Freq: Four times a day (QID) | INTRAVENOUS | Status: DC | PRN

## 2022-01-05 MED ORDER — HYDROMORPHONE HCL 1 MG/ML IJ SOLN
INTRAMUSCULAR | Status: AC
  Administered 2022-01-05: 0.25 mg via INTRAVENOUS
  Filled 2022-01-05: qty 1

## 2022-01-05 MED ORDER — ACETAMINOPHEN 325 MG PO TABS: 325.0000 mg | ORAL_TABLET | Freq: Four times a day (QID) | ORAL | Status: DC | PRN

## 2022-01-05 MED ORDER — HYDROMORPHONE HCL 1 MG/ML IJ SOLN
0.5000 mg | INTRAMUSCULAR | Status: DC | PRN
  Administered 2022-01-05 – 2022-01-06 (×2): 1 mg via INTRAVENOUS
  Filled 2022-01-05 (×2): qty 1

## 2022-01-05 MED ORDER — PANTOPRAZOLE SODIUM 40 MG PO TBEC
40.0000 mg | DELAYED_RELEASE_TABLET | Freq: Every day | ORAL | Status: DC
  Administered 2022-01-06: 40 mg via ORAL
  Filled 2022-01-05: qty 1

## 2022-01-05 MED ORDER — ROSUVASTATIN CALCIUM 20 MG PO TABS
20.0000 mg | ORAL_TABLET | Freq: Every day | ORAL | Status: DC
  Administered 2022-01-06: 20 mg via ORAL
  Filled 2022-01-05: qty 1

## 2022-01-05 MED ORDER — PROPOFOL 500 MG/50ML IV EMUL
INTRAVENOUS | Status: DC | PRN
  Administered 2022-01-05: 75 ug/kg/min via INTRAVENOUS

## 2022-01-05 MED ORDER — ALUM & MAG HYDROXIDE-SIMETH 200-200-20 MG/5ML PO SUSP: 30.0000 mL | ORAL | Status: DC | PRN

## 2022-01-05 MED ORDER — TRANEXAMIC ACID-NACL 1000-0.7 MG/100ML-% IV SOLN
1000.0000 mg | INTRAVENOUS | Status: AC
  Administered 2022-01-05: 1000 mg via INTRAVENOUS
  Filled 2022-01-05: qty 100

## 2022-01-05 MED ORDER — MIDAZOLAM HCL 5 MG/5ML IJ SOLN
INTRAMUSCULAR | Status: DC | PRN
  Administered 2022-01-05: 2 mg via INTRAVENOUS

## 2022-01-05 MED ORDER — AMLODIPINE BESYLATE 5 MG PO TABS
5.0000 mg | ORAL_TABLET | Freq: Every day | ORAL | Status: DC
  Filled 2022-01-05: qty 1

## 2022-01-05 MED ORDER — 0.9 % SODIUM CHLORIDE (POUR BTL) OPTIME
TOPICAL | Status: DC | PRN
  Administered 2022-01-05: 1000 mL

## 2022-01-05 MED ORDER — SODIUM CHLORIDE 0.9 % IV SOLN: INTRAVENOUS | Status: DC

## 2022-01-05 MED ORDER — EPHEDRINE SULFATE (PRESSORS) 50 MG/ML IJ SOLN
INTRAMUSCULAR | Status: DC | PRN
  Administered 2022-01-05: 10 mg via INTRAVENOUS

## 2022-01-05 MED ORDER — METHOCARBAMOL 500 MG PO TABS
ORAL_TABLET | ORAL | Status: AC
  Filled 2022-01-05: qty 1

## 2022-01-05 MED ORDER — MIDAZOLAM HCL 2 MG/2ML IJ SOLN
INTRAMUSCULAR | Status: AC
  Filled 2022-01-05: qty 2

## 2022-01-05 MED ORDER — MENTHOL 3 MG MT LOZG: 1.0000 | LOZENGE | OROMUCOSAL | Status: DC | PRN

## 2022-01-05 MED ORDER — CHLORHEXIDINE GLUCONATE 0.12 % MT SOLN
15.0000 mL | Freq: Once | OROMUCOSAL | Status: AC
  Administered 2022-01-05: 15 mL via OROMUCOSAL

## 2022-01-05 MED ORDER — OXYCODONE HCL 5 MG PO TABS
10.0000 mg | ORAL_TABLET | ORAL | Status: DC | PRN
  Administered 2022-01-05: 15 mg via ORAL
  Filled 2022-01-05: qty 3

## 2022-01-05 MED ORDER — CEFAZOLIN SODIUM-DEXTROSE 2-4 GM/100ML-% IV SOLN
2.0000 g | INTRAVENOUS | Status: AC
  Administered 2022-01-05: 2 g via INTRAVENOUS
  Filled 2022-01-05: qty 100

## 2022-01-05 MED ORDER — ONDANSETRON HCL 4 MG/2ML IJ SOLN
INTRAMUSCULAR | Status: DC | PRN
  Administered 2022-01-05: 4 mg via INTRAVENOUS

## 2022-01-05 MED ORDER — SODIUM CHLORIDE 0.9 % IR SOLN
Status: DC | PRN
  Administered 2022-01-05: 1000 mL

## 2022-01-05 MED ORDER — PHENYLEPHRINE HCL (PRESSORS) 10 MG/ML IV SOLN
INTRAVENOUS | Status: DC | PRN
  Administered 2022-01-05 (×4): 160 ug via INTRAVENOUS
  Administered 2022-01-05: 80 ug via INTRAVENOUS

## 2022-01-05 MED ORDER — DIPHENHYDRAMINE HCL 12.5 MG/5ML PO ELIX: 12.5000 mg | ORAL_SOLUTION | ORAL | Status: DC | PRN

## 2022-01-05 MED ORDER — DOCUSATE SODIUM 100 MG PO CAPS
100.0000 mg | ORAL_CAPSULE | Freq: Two times a day (BID) | ORAL | Status: DC
  Administered 2022-01-05 – 2022-01-06 (×2): 100 mg via ORAL
  Filled 2022-01-05 (×2): qty 1

## 2022-01-05 MED ORDER — OXYCODONE HCL 5 MG PO TABS
5.0000 mg | ORAL_TABLET | ORAL | Status: DC | PRN
  Administered 2022-01-05: 5 mg via ORAL
  Administered 2022-01-05 – 2022-01-06 (×3): 10 mg via ORAL
  Filled 2022-01-05 (×3): qty 2
  Filled 2022-01-05: qty 1

## 2022-01-05 MED ORDER — FENTANYL CITRATE (PF) 100 MCG/2ML IJ SOLN
INTRAMUSCULAR | Status: AC
  Filled 2022-01-05: qty 2

## 2022-01-05 MED ORDER — METHOCARBAMOL 500 MG PO TABS
500.0000 mg | ORAL_TABLET | Freq: Four times a day (QID) | ORAL | Status: DC | PRN
  Administered 2022-01-05 – 2022-01-06 (×3): 500 mg via ORAL
  Filled 2022-01-05 (×2): qty 1

## 2022-01-05 MED ORDER — ONDANSETRON HCL 4 MG/2ML IJ SOLN: 4.0000 mg | Freq: Four times a day (QID) | INTRAMUSCULAR | Status: DC | PRN

## 2022-01-05 MED ORDER — ORAL CARE MOUTH RINSE: 15.0000 mL | Freq: Once | OROMUCOSAL | Status: AC

## 2022-01-05 MED ORDER — INSULIN ASPART 100 UNIT/ML IJ SOLN
INTRAMUSCULAR | Status: AC
  Filled 2022-01-05: qty 1

## 2022-01-05 MED ORDER — HYDROMORPHONE HCL 1 MG/ML IJ SOLN
0.2500 mg | INTRAMUSCULAR | Status: DC | PRN
  Administered 2022-01-05: 0.5 mg via INTRAVENOUS
  Administered 2022-01-05: 0.25 mg via INTRAVENOUS

## 2022-01-05 MED ORDER — METFORMIN HCL ER 750 MG PO TB24
750.0000 mg | ORAL_TABLET | Freq: Two times a day (BID) | ORAL | Status: DC
  Administered 2022-01-05 – 2022-01-06 (×2): 750 mg via ORAL
  Filled 2022-01-05 (×2): qty 1

## 2022-01-05 MED ORDER — METOCLOPRAMIDE HCL 5 MG/ML IJ SOLN: 5.0000 mg | Freq: Three times a day (TID) | INTRAMUSCULAR | Status: DC | PRN

## 2022-01-05 MED ORDER — PHENOL 1.4 % MT LIQD: 1.0000 | OROMUCOSAL | Status: DC | PRN

## 2022-01-05 MED ORDER — POVIDONE-IODINE 10 % EX SWAB
2.0000 | Freq: Once | CUTANEOUS | Status: AC
  Administered 2022-01-05: 2 via TOPICAL

## 2022-01-05 MED ORDER — POLYETHYLENE GLYCOL 3350 17 G PO PACK: 17.0000 g | PACK | Freq: Every day | ORAL | Status: DC | PRN

## 2022-01-05 MED ORDER — DEXAMETHASONE SODIUM PHOSPHATE 10 MG/ML IJ SOLN
INTRAMUSCULAR | Status: DC | PRN
  Administered 2022-01-05: 10 mg via INTRAVENOUS

## 2022-01-05 MED ORDER — LACTATED RINGERS IV SOLN: INTRAVENOUS | Status: DC

## 2022-01-05 SURGICAL SUPPLY — 39 items
BAG COUNTER SPONGE SURGICOUNT (BAG) ×1 IMPLANT
BAG ZIPLOCK 12X15 (MISCELLANEOUS) IMPLANT
BENZOIN TINCTURE PRP APPL 2/3 (GAUZE/BANDAGES/DRESSINGS) IMPLANT
BLADE SAW SGTL 18X1.27X75 (BLADE) ×1 IMPLANT
COVER PERINEAL POST (MISCELLANEOUS) ×1 IMPLANT
COVER SURGICAL LIGHT HANDLE (MISCELLANEOUS) ×1 IMPLANT
CUP ACET PNNCL SECTR W/GRIP 56 (Hips) IMPLANT
DRAPE FOOT SWITCH (DRAPES) ×1 IMPLANT
DRAPE STERI IOBAN 125X83 (DRAPES) ×1 IMPLANT
DRAPE U-SHAPE 47X51 STRL (DRAPES) ×2 IMPLANT
DRSG AQUACEL AG ADV 3.5X10 (GAUZE/BANDAGES/DRESSINGS) ×1 IMPLANT
DURAPREP 26ML APPLICATOR (WOUND CARE) ×1 IMPLANT
ELECT REM PT RETURN 15FT ADLT (MISCELLANEOUS) ×1 IMPLANT
GAUZE XEROFORM 1X8 LF (GAUZE/BANDAGES/DRESSINGS) ×1 IMPLANT
GLOVE BIO SURGEON STRL SZ7.5 (GLOVE) ×1 IMPLANT
GLOVE BIOGEL PI IND STRL 8 (GLOVE) ×2 IMPLANT
GLOVE ECLIPSE 8.0 STRL XLNG CF (GLOVE) ×1 IMPLANT
GOWN STRL REUS W/ TWL XL LVL3 (GOWN DISPOSABLE) ×2 IMPLANT
GOWN STRL REUS W/TWL XL LVL3 (GOWN DISPOSABLE) ×2
HANDPIECE INTERPULSE COAX TIP (DISPOSABLE) ×1
HEAD CERAMIC DELTA 36 PLUS 1.5 (Hips) IMPLANT
HOLDER FOLEY CATH W/STRAP (MISCELLANEOUS) ×1 IMPLANT
KIT TURNOVER KIT A (KITS) IMPLANT
PACK ANTERIOR HIP CUSTOM (KITS) ×1 IMPLANT
PINN SECTOR W/GRIP ACE CUP 56 (Hips) ×1 IMPLANT
PINNACLE ALTRX PLUS 4 N 36X56 (Hips) IMPLANT
SET HNDPC FAN SPRY TIP SCT (DISPOSABLE) ×1 IMPLANT
STAPLER VISISTAT 35W (STAPLE) IMPLANT
STEM FEMORAL SZ6 HIGH ACTIS (Stem) IMPLANT
STRIP CLOSURE SKIN 1/2X4 (GAUZE/BANDAGES/DRESSINGS) IMPLANT
SUT ETHIBOND NAB CT1 #1 30IN (SUTURE) ×1 IMPLANT
SUT ETHILON 2 0 PS N (SUTURE) IMPLANT
SUT MNCRL AB 4-0 PS2 18 (SUTURE) IMPLANT
SUT VIC AB 0 CT1 36 (SUTURE) ×1 IMPLANT
SUT VIC AB 1 CT1 36 (SUTURE) ×1 IMPLANT
SUT VIC AB 2-0 CT1 27 (SUTURE) ×2
SUT VIC AB 2-0 CT1 TAPERPNT 27 (SUTURE) ×2 IMPLANT
TRAY FOLEY MTR SLVR 16FR STAT (SET/KITS/TRAYS/PACK) IMPLANT
YANKAUER SUCT BULB TIP NO VENT (SUCTIONS) ×1 IMPLANT

## 2022-01-05 NOTE — Anesthesia Postprocedure Evaluation (Signed)
Anesthesia Post Note  Patient: Justin Mcdaniel  Procedure(s) Performed: RIGHT TOTAL HIP ARTHROPLASTY ANTERIOR APPROACH (Right: Hip)     Patient location during evaluation: PACU Anesthesia Type: Spinal Level of consciousness: awake and alert Pain management: pain level controlled Vital Signs Assessment: post-procedure vital signs reviewed and stable Respiratory status: spontaneous breathing, nonlabored ventilation and respiratory function stable Cardiovascular status: blood pressure returned to baseline and stable Postop Assessment: no apparent nausea or vomiting Anesthetic complications: no   No notable events documented.  Last Vitals:  Vitals:   01/05/22 1145 01/05/22 1200  BP: 114/70 124/80  Pulse: (!) 54 (!) 57  Resp: 11 13  Temp:    SpO2: 100% 100%    Last Pain:  Vitals:   01/05/22 1205  TempSrc:   PainSc: [place]

## 2022-01-05 NOTE — TOC Transition Note (Signed)
Transition of Care [facility]) - CM/SW Discharge Note   Patient Details  Name: Justin Mcdaniel MRN: [id number] Date of Birth: [birth year]  Transition of Care [facility]) CM/SW Contact:  Lennart Pall, LCSW Phone Number: 01/05/2022, 3:02 PM   Clinical Narrative:    Met with pt and wife and confirming pt does need a RW - no DME agency preference - placed order with [facility] for delivery to pt's room.  HHPT prearranged with [facility].  No further TOC needs.   Final next level of care: [place] Barriers to Discharge: No Barriers Identified   Patient Goals and CMS Choice Patient states their goals for this hospitalization and ongoing recovery are:: return home      Discharge Placement                       Discharge Plan and Services                DME Arranged: [place] rolling DME Agency: [employer] Date DME Agency Contacted: 01/05/22 Time DME Agency Contacted: 1502 Representative spoke with at DME Agency: [place]: PT [place]: [place] Determinants of Health (SDOH) Interventions     Readmission Risk Interventions     No data to display

## 2022-01-05 NOTE — Anesthesia Preprocedure Evaluation (Signed)
Anesthesia Evaluation  Patient identified by MRN, date of birth, ID band Patient awake    Reviewed: Allergy & Precautions, NPO status , Patient's Chart, lab work & pertinent test results  Airway Mallampati: II  TM Distance: >3 FB Neck ROM: Full    Dental no notable dental hx.    Pulmonary neg pulmonary ROS, former smoker,    Pulmonary exam normal breath sounds clear to auscultation       Cardiovascular negative cardio ROS Normal cardiovascular exam Rhythm:Regular Rate:Normal     Neuro/Psych negative neurological ROS  negative psych ROS   GI/Hepatic negative GI ROS, Neg liver ROS,   Endo/Other  diabetes, Type 2, Oral Hypoglycemic Agents  Renal/GU negative Renal ROS  negative genitourinary   Musculoskeletal  (+) Arthritis , Osteoarthritis,    Abdominal   Peds negative pediatric ROS (+)  Hematology negative hematology ROS (+)   Anesthesia Other Findings   Reproductive/Obstetrics negative OB ROS                             Anesthesia Physical Anesthesia Plan  ASA: 3  Anesthesia Plan: Spinal   Post-op Pain Management: Minimal or no pain anticipated   Induction: Intravenous  PONV Risk Score and Plan: 1 and Ondansetron and Treatment may vary due to age or medical condition  Airway Management Planned: Simple Face Mask  Additional Equipment:   Intra-op Plan:   Post-operative Plan:   Informed Consent: I have reviewed the patients History and Physical, chart, labs and discussed the procedure including the risks, benefits and alternatives for the proposed anesthesia with the patient or authorized representative who has indicated his/her understanding and acceptance.     Dental advisory given  Plan Discussed with: CRNA  Anesthesia Plan Comments:         Anesthesia Quick Evaluation

## 2022-01-05 NOTE — Op Note (Signed)
Operative Note  Date of operation: 01/05/2022 Operative diagnosis: Right hip severe osteoarthritis Postoperative diagnosis: Same  Procedure: Right direct anterior total hip arthroplasty  Implants: DePuy sector GRIPTION acetabular clinical size 56, 36+4 neutral polythene liner, size 6 Actis femoral component with high offset, 36+1.5 ceramic head ball  Surgeon: Lind Guest. Ninfa Linden, MD Assistant: Benita Stabile, PA-C  Anesthesia: Spinal Antibiotics: 2 g IV Ancef EBL: 409 cc Complications: None  Indications: The patient is a 66 year old gentleman with well-documented end-stage arthritis involving his right hip.  He used to be a daily runner.  At this point his right hip pain is detrimentally affecting his mobility, his quality of life, and his activities a living and has been worsening.  He has had at least 2 steroid injections under ultrasound guidance in his right hip joint that used to work but now they are not working.  His pain can be 10 out of times daily with that right hip.  At this point he does wish to proceed with a hip replacement agree with this as well.  We talked in length in detail about the risk of acute blood loss anemia, nerve vessel injury, fracture, infection, DVT, dislocation, implant failure, leg length differences and wound healing issues.  We talked about her goals being decreased pain, improve mobility, and improve quality of life.  Procedure description: After informed consent was obtained and appropriate right hip was marked, the patient was brought to the operating room and set up on the stretcher where spinal anesthesia was obtained.  He was then laid in supine position on stretcher and a Foley catheter was placed.  Traction boots were then placed on both his feet.  He was next placed supine on the Hana fracture table with a perineal post in place in both legs and inline skeletal traction but no traction applied.  We assessed his hip radiographically and the right hip  was prepped and draped with DuraPrep and sterile drapes.  A timeout was called and identified as correct patient the correct right hip.  I then made incision just inferior and posterior to the ASIS and carried this slightly bleeding down the leg.  Dissection was carried down the tensor fascia lata muscle and the tensor fascia was then divided longitudinally to proceed with directed approach to the hip.  Identified and cauterized circumflex vessels identified the hip capsule over the hip capsule and a L-type format finding a moderate joint effusion.  Cobra tractors were placed around the medial and lateral femoral neck and a femoral neck cut was made with an oscillating saw proximal to the lesser trochanter and completed with an osteotome.  A corkscrew guide was placed in the femoral head and the femoral head was removed in its entirety and found a wide area devoid of cartilage which was quite significant.  A bent Hohmann was then placed over the medial acetabular rim and remnants of the acetabular labrum and other debris removed.  We then began reaming under regularization from a size 43 reamer and stepwise increments going up to a size 55 reamer with all reamers placed under direct visitation in the last reamer placed under direct fluoroscopy so we could obtain our depth reaming, inclination and anteversion.  The real DePuy sector Pierson #component size 56 was then placed without difficulty followed by a 36+4 polythene liner based on the patient's offset.  Attention was then turned to the femur, with the leg externally rotated, extended and a deducted, and medial retractor was placed medially and  a Hohmann retractor was placed behind the greater trochanter.  The lateral joint capsule was released and a box cutting osteotome was used to enter the femoral canal.  Broaching was then initiated from a starter broach going all the way up to a size 6 broach with all broaches placed under direct visualization.  I then  placed a high offset femoral neck for the size 6 broach and a 36+1.5 trial hip ball and reduce this in the acetabulum.  We assessed it clinically and radiographically and were pleased with leg length, offset, range of motion and stability.  The trial components were then dislocated and we placed the real Actis femoral component size 6 with high offset and the real 36+1.5 ceramic head ball and again reduce this and acetabular pleased with stability, leg length, offset and range of motion assessed mechanically and radiographically.  The soft tissue was then irrigated with normal saline solution.  The joint capsule was closed with interrupted #1 Ethibond suture followed by #1 Vicryl to close the tensor fascia.  0 Vicryl was used to close the deep tissue and 2-0 Vicryl is used to close subcutaneous tissue.  The skin was closed with staples.  An Aquacel dressing was applied.  The patient was taken off the Hana table and taken recovery in stable condition with all final counts being correct and no complications noted.  Of note Benita Stabile, PA-C did assist in the entire case from beginning to end and his assistance was medically necessary for soft tissue management and retraction, helping guide implant placement and a layered closure of the wound.

## 2022-01-05 NOTE — Evaluation (Signed)
Physical Therapy Evaluation Patient Details Name: Justin Mcdaniel MRN: [id number] DOB: 10/26/1955 Today's Date: 01/05/2022  History of Present Illness  Pt s/p R THR  Clinical Impression  Pt s/p R THR and presents with decreased R LE strength/ROM and post op pain limiting functional mobility.  Pt should progress to dc home with family assist.     Recommendations for follow up therapy are one component of a multi-disciplinary discharge planning process, led by the attending physician.  Recommendations may be updated based on patient status, additional functional criteria and insurance authorization.  Follow Up Recommendations Follow physician's recommendations for discharge plan and follow up therapies      Assistance Recommended at Discharge Intermittent Supervision/Assistance  Patient can return home with the following  A little help with walking and/or transfers;A little help with bathing/dressing/bathroom;Assistance with cooking/housework;Assist for transportation;Help with stairs or ramp for entrance    Equipment Recommendations Rolling walker (2 wheels) (has been delivered to room)  Recommendations for Other Services       Functional Status Assessment Patient has had a recent decline in their functional status and demonstrates the ability to make significant improvements in function in a reasonable and predictable amount of time.     Precautions / Restrictions Precautions Precautions: Fall Restrictions Weight Bearing Restrictions: No RLE Weight Bearing: Weight bearing as tolerated      Mobility  Bed Mobility Overal bed mobility: Needs Assistance Bed Mobility: Supine to Sit     Supine to sit: Min assist     General bed mobility comments: Increased time with cues for sequence and use of L LE to self assist    Transfers Overall transfer level: Needs assistance Equipment used: Rolling walker (2 wheels) Transfers: Sit to/from Stand Sit to Stand: Min assist            General transfer comment: cues for LE management and use of UEs to self assist    Ambulation/Gait Ambulation/Gait assistance: Min assist, Min guard Gait Distance (Feet): 54 Feet Assistive device: Rolling walker (2 wheels) Gait Pattern/deviations: Step-to pattern, Decreased step length - right, Decreased step length - left, Shuffle, Trunk flexed Gait velocity: decr     General Gait Details: cues for sequence, posture and position from [employer]            Wheelchair Mobility    Modified Rankin (Stroke Patients Only)       Balance Overall balance assessment: Needs assistance Sitting-balance support: No upper extremity supported, Feet supported Sitting balance-Leahy Scale: Good     Standing balance support: Single extremity supported Standing balance-Leahy Scale: Poor                               Pertinent Vitals/Pain Pain Assessment Pain Assessment: 0-10 Pain Score: 6  Pain Location: R hip Pain Descriptors / Indicators: Aching, Sore Pain Intervention(s): Limited activity within patient's tolerance, Monitored during session, Premedicated before session, Ice applied    Home Living Family/patient expects to be discharged to:: Private residence Living Arrangements: Spouse/significant other Available Help at Discharge: Family Type of Home: House Home Access: Stairs to enter Entrance Stairs-Rails: Right;Left;Can reach both Technical brewer of Steps: 3   Home Layout: Able to live on main level with bedroom/bathroom Home Equipment: None      Prior Function Prior Level of Function : Independent/Modified Independent  Hand Dominance        Extremity/Trunk Assessment   Upper Extremity Assessment Upper Extremity Assessment: Overall WFL for tasks assessed    Lower Extremity Assessment Lower Extremity Assessment: RLE deficits/detail    Cervical / Trunk Assessment Cervical / Trunk Assessment: Normal   Communication   Communication: No difficulties  Cognition Arousal/Alertness: Awake/alert Behavior During Therapy: WFL for tasks assessed/performed Overall Cognitive Status: Within Functional Limits for tasks assessed                                          General Comments      Exercises     Assessment/Plan    PT Assessment Patient needs continued PT services  PT Problem List Decreased strength;Decreased range of motion;Decreased activity tolerance;Decreased balance;Decreased mobility;Decreased knowledge of use of DME;Pain       PT Treatment Interventions DME instruction;Gait training;Stair training;Functional mobility training;Therapeutic activities;Therapeutic exercise;Patient/family education    PT Goals (Current goals can be found in the Care Plan section)  Acute Rehab PT Goals Patient Stated Goal: Regain IND PT Goal Formulation: With patient Time For Goal Achievement: 01/12/22 Potential to Achieve Goals: Good    Frequency 7X/week     Co-evaluation               AM-PAC PT "6 Clicks" Mobility  Outcome Measure Help needed turning from your back to your side while in a flat bed without using bedrails?: A Little Help needed moving from lying on your back to sitting on the side of a flat bed without using bedrails?: A Little Help needed moving to and from a bed to a chair (including a wheelchair)?: A Little Help needed standing up from a chair using your arms (e.g., wheelchair or bedside chair)?: A Little Help needed to walk in hospital room?: A Little Help needed climbing 3-5 steps with a railing? : A Lot 6 Click Score: 17    End of Session Equipment Utilized During Treatment: Gait belt Activity Tolerance: Patient tolerated treatment well Patient left: in chair;with call bell/phone within reach;with chair alarm set Nurse Communication: Mobility status PT Visit Diagnosis: Difficulty in walking, not elsewhere classified (R26.2)    Time:  8341-9622 PT Time Calculation (min) (ACUTE ONLY): 23 min   Charges:   PT Evaluation $PT Eval Low Complexity: 1 Low PT Treatments $Gait Training: 8-22 mins        [place] Pager [phone] Office [phone]   Ileane Sando 01/05/2022, 6:30 PM

## 2022-01-05 NOTE — Anesthesia Procedure Notes (Signed)
Spinal  Patient location during procedure: OR Start time: 01/05/2022 9:35 AM End time: 01/05/2022 9:40 AM Reason for block: surgical anesthesia Staffing Performed: anesthesiologist  Anesthesiologist: Lynda Rainwater, MD Performed by: Lynda Rainwater, MD Authorized by: Lynda Rainwater, MD   Preanesthetic Checklist Completed: patient identified, IV checked, site marked, risks and benefits discussed, surgical consent, monitors and equipment checked, pre-op evaluation and timeout performed Spinal Block Patient position: sitting Prep: DuraPrep Patient monitoring: heart rate, cardiac monitor, continuous pulse ox and blood pressure Approach: midline Location: L3-4 Injection technique: single-shot Needle Needle type: Sprotte  Needle gauge: 24 G Needle length: 9 cm Assessment Sensory level: T4 Events: CSF return

## 2022-01-05 NOTE — Plan of Care (Signed)

## 2022-01-05 NOTE — Interval H&P Note (Signed)
History and Physical Interval Note: The patient understands that he is here today for right hip replacement to treat his right hip arthritis.  There has been no acute or interval change in his medical status.  See H&P.  The risks and benefits of surgery been explained in detail and informed consent is obtained.  The right operative hip has been marked.  01/05/2022 8:27 AM  Justin Mcdaniel  has presented today for surgery, with the diagnosis of OSTEOARTHRITIS / [place].  The various methods of treatment have been discussed with the patient and family. After consideration of risks, benefits and other options for treatment, the patient has consented to  Procedure(s): RIGHT TOTAL HIP ARTHROPLASTY ANTERIOR APPROACH (Right) as a surgical intervention.  The patient's history has been reviewed, patient examined, no change in status, stable for surgery.  I have reviewed the patient's chart and labs.  Questions were answered to the patient's satisfaction.     Justin Mcdaniel

## 2022-01-05 NOTE — Progress Notes (Signed)
Prior to surgery; bedside glucose test performed- 257.  Informed Dr. Sabra Heck- orders given for Insulin SQ. Repeat bedside Glucose 1 hr later: 163

## 2022-01-05 NOTE — Transfer of Care (Signed)
Immediate Anesthesia Transfer of Care Note  Patient: Justin Mcdaniel  Procedure(s) Performed: RIGHT TOTAL HIP ARTHROPLASTY ANTERIOR APPROACH (Right: Hip)  Patient Location: PACU  Anesthesia Type:Spinal  Level of Consciousness: sedated, patient cooperative and responds to stimulation  Airway & Oxygen Therapy: Patient Spontanous Breathing and Patient connected to face mask oxygen  Post-op Assessment: Report given to RN and Post -op Vital signs reviewed and stable  Post vital signs: Reviewed and stable  Last Vitals:  Vitals Value Taken Time  BP 110/96 01/05/22 1104  Temp    Pulse 81 01/05/22 1105  Resp 21 01/05/22 1106  SpO2 99 % 01/05/22 1105  Vitals shown include unvalidated device data.  Last Pain:  Vitals:   01/05/22 0757  TempSrc:   PainSc: 8       Patients Stated Pain Goal: 3 (79/02/40 9735)  Complications: No notable events documented.

## 2022-01-06 DIAGNOSIS — Z7984 Long term (current) use of oral hypoglycemic drugs: Secondary | ICD-10-CM | POA: Diagnosis not present

## 2022-01-06 DIAGNOSIS — Z79899 Other long term (current) drug therapy: Secondary | ICD-10-CM | POA: Diagnosis not present

## 2022-01-06 DIAGNOSIS — I2510 Atherosclerotic heart disease of native coronary artery without angina pectoris: Secondary | ICD-10-CM | POA: Diagnosis not present

## 2022-01-06 DIAGNOSIS — M1611 Unilateral primary osteoarthritis, right hip: Secondary | ICD-10-CM | POA: Diagnosis not present

## 2022-01-06 DIAGNOSIS — Z87891 Personal history of nicotine dependence: Secondary | ICD-10-CM | POA: Diagnosis not present

## 2022-01-06 DIAGNOSIS — Z7982 Long term (current) use of aspirin: Secondary | ICD-10-CM | POA: Diagnosis not present

## 2022-01-06 LAB — CBC
HCT: 34.3 % — ABNORMAL LOW (ref 39.0–52.0)
Hemoglobin: 11.8 g/dL — ABNORMAL LOW (ref 13.0–17.0)
MCH: 31.9 pg (ref 26.0–34.0)
MCHC: 34.4 g/dL (ref 30.0–36.0)
MCV: 92.7 fL (ref 80.0–100.0)
Platelets: 227 10*3/uL (ref 150–400)
RBC: 3.7 MIL/uL — ABNORMAL LOW (ref 4.22–5.81)
RDW: 11.9 % (ref 11.5–15.5)
WBC: 19.9 10*3/uL — ABNORMAL HIGH (ref 4.0–10.5)
nRBC: 0 % (ref 0.0–0.2)

## 2022-01-06 LAB — BASIC METABOLIC PANEL
Anion gap: 6 (ref 5–15)
BUN: 16 mg/dL (ref 8–23)
CO2: 27 mmol/L (ref 22–32)
Calcium: 9 mg/dL (ref 8.9–10.3)
Chloride: 105 mmol/L (ref 98–111)
Creatinine, Ser: 0.9 mg/dL (ref 0.61–1.24)
GFR, Estimated: >60 mL/min (ref >60)
Glucose, Bld: 154 mg/dL — ABNORMAL HIGH (ref 70–99)
Potassium: 4 mmol/L (ref 3.5–5.1)
Sodium: 138 mmol/L (ref 135–145)

## 2022-01-06 MED ORDER — ASPIRIN 81 MG PO CHEW: 81.0000 mg | CHEWABLE_TABLET | Freq: Two times a day (BID) | ORAL | 0 refills | Status: AC

## 2022-01-06 MED ORDER — METHOCARBAMOL 500 MG PO TABS: 500.0000 mg | ORAL_TABLET | Freq: Four times a day (QID) | ORAL | 1 refills | Status: DC | PRN

## 2022-01-06 MED ORDER — OXYCODONE HCL 5 MG PO TABS: 5.0000 mg | ORAL_TABLET | ORAL | 0 refills | Status: DC | PRN

## 2022-01-06 NOTE — Progress Notes (Signed)
Subjective: 1 Day Post-Op Procedure(s) (LRB): RIGHT TOTAL HIP ARTHROPLASTY ANTERIOR APPROACH (Right) Patient reports pain as moderate.  Has done well with his mobility per PT.  Objective: Vital signs in last 24 hours: Temp:  [97.6 F (36.4 C)-98.8 F (37.1 C)] 98.8 F (37.1 C) (10/21 0924) Pulse Rate:  [54-99] 78 (10/21 0924) Resp:  [11-21] 16 (10/21 0924) BP: (98-149)/(69-96) 113/75 (10/21 0924) SpO2:  [94 %-100 %] 94 % (10/21 0924)  Intake/Output from previous day: 10/20 0701 - 10/21 0700 In: 2958.6 [P.O.:600; I.V.:2158.6; IV Piggyback:200] Out: 2250 [Urine:2050; Blood:200] Intake/Output this shift: Total I/O In: 771.2 [P.O.:600; I.V.:171.2] Out: 125 [Urine:125]  Recent Labs    01/06/22 0335  HGB 11.8*   Recent Labs    01/06/22 0335  WBC 19.9*  RBC 3.70*  HCT 34.3*  PLT 227   Recent Labs    01/06/22 0335  NA 138  K 4.0  CL 105  CO2 27  BUN 16  CREATININE 0.90  GLUCOSE 154*  CALCIUM 9.0   No results for input(s): "LABPT", "INR" in the last 72 hours.  Sensation intact distally Intact pulses distally Dorsiflexion/Plantar flexion intact Incision: scant drainage   Assessment/Plan: 1 Day Post-Op Procedure(s) (LRB): RIGHT TOTAL HIP ARTHROPLASTY ANTERIOR APPROACH (Right) Up with therapy Discharge home with home health this afternoon.      Mcarthur Rossetti 01/06/2022, 10:06 AM

## 2022-01-06 NOTE — Plan of Care (Signed)
  Problem: Pain Management: Goal: Pain level will decrease with appropriate interventions Outcome: Progressing   Problem: Coping: Goal: Level of anxiety will decrease Outcome: Progressing   

## 2022-01-06 NOTE — Plan of Care (Signed)
  Problem: Pain Management: Goal: Pain level will decrease with appropriate interventions Outcome: Progressing   Problem: Skin Integrity: Goal: Will show signs of wound healing Outcome: Progressing   Problem: Education: Goal: Knowledge of General Education information will improve Description: Including pain rating scale, medication(s)/side effects and non-pharmacologic comfort measures Outcome: Progressing   Problem: Clinical Measurements: Goal: Respiratory complications will improve Outcome: Progressing   Problem: Clinical Measurements: Goal: Cardiovascular complication will be avoided Outcome: Progressing

## 2022-01-06 NOTE — Discharge Instructions (Signed)

## 2022-01-06 NOTE — Progress Notes (Signed)
Physical Therapy Treatment Patient Details Name: Justin Mcdaniel MRN: [id number] DOB: Nov 20, 1955 Today's Date: 01/06/2022   History of Present Illness Pt s/p R THR    PT Comments    Pt very motivated and progressing well with mobility.  Pt up to ambulate increased distance in hall, negotiated stairs and therex program initiated.  Pt eager for dc home this date.   Recommendations for follow up therapy are one component of a multi-disciplinary discharge planning process, led by the attending physician.  Recommendations may be updated based on patient status, additional functional criteria and insurance authorization.  Follow Up Recommendations  Follow physician's recommendations for discharge plan and follow up therapies     Assistance Recommended at Discharge Intermittent Supervision/Assistance  Patient can return home with the following A little help with walking and/or transfers;A little help with bathing/dressing/bathroom;Assistance with cooking/housework;Assist for transportation;Help with stairs or ramp for entrance   Equipment Recommendations  Rolling walker (2 wheels)    Recommendations for Other Services       Precautions / Restrictions Precautions Precautions: Fall Restrictions Weight Bearing Restrictions: No RLE Weight Bearing: Weight bearing as tolerated     Mobility  Bed Mobility               General bed mobility comments: Pt up in chair and requests back to same    Transfers Overall transfer level: Needs assistance Equipment used: Rolling walker (2 wheels) Transfers: Sit to/from Stand Sit to Stand: Min guard, Supervision           General transfer comment: cues for LE management and use of UEs to self assist    Ambulation/Gait Ambulation/Gait assistance: Min guard, Supervision Gait Distance (Feet): 240 Feet Assistive device: Rolling walker (2 wheels) Gait Pattern/deviations: Decreased step length - right, Decreased step length - left, Shuffle,  Trunk flexed, Step-to pattern, Step-through pattern Gait velocity: decr     General Gait Details: cues for sequence, posture and position from RW   Stairs Stairs: Yes Stairs assistance: Supervision Stair Management: Step to pattern, Forwards, Two rails Number of Stairs: 3 General stair comments: min cues for sequence   Wheelchair Mobility    Modified Rankin (Stroke Patients Only)       Balance Overall balance assessment: Needs assistance Sitting-balance support: No upper extremity supported, Feet supported Sitting balance-Leahy Scale: Good     Standing balance support: No upper extremity supported Standing balance-Leahy Scale: Fair                              Cognition Arousal/Alertness: Awake/alert Behavior During Therapy: WFL for tasks assessed/performed Overall Cognitive Status: Within Functional Limits for tasks assessed                                          Exercises Total Joint Exercises Ankle Circles/Pumps: AROM, Both, 15 reps, Supine Quad Sets: AROM, Both, Supine, 10 reps Heel Slides: AAROM, Right, 20 reps, Supine Hip ABduction/ADduction: AAROM, Right, 15 reps, Supine    General Comments        Pertinent Vitals/Pain Pain Assessment Pain Assessment: 0-10 Pain Score: 3  Pain Location: R hip Pain Descriptors / Indicators: Aching, Sore Pain Intervention(s): Limited activity within patient's tolerance, Monitored during session, Premedicated before session, Ice applied    Home Living  Prior Function            PT Goals (current goals can now be found in the care plan section) Acute Rehab PT Goals Patient Stated Goal: Regain IND PT Goal Formulation: With patient Time For Goal Achievement: 01/12/22 Potential to Achieve Goals: Good Progress towards PT goals: Progressing toward goals    Frequency    7X/week      PT Plan Current plan remains appropriate    Co-evaluation               AM-PAC PT "6 Clicks" Mobility   Outcome Measure  Help needed turning from your back to your side while in a flat bed without using bedrails?: A Little Help needed moving from lying on your back to sitting on the side of a flat bed without using bedrails?: A Little Help needed moving to and from a bed to a chair (including a wheelchair)?: A Little Help needed standing up from a chair using your arms (e.g., wheelchair or bedside chair)?: A Little Help needed to walk in hospital room?: A Little Help needed climbing 3-5 steps with a railing? : A Little 6 Click Score: 18    End of Session Equipment Utilized During Treatment: Gait belt Activity Tolerance: Patient tolerated treatment well Patient left: in chair;with call bell/phone within reach;with chair alarm set Nurse Communication: Mobility status PT Visit Diagnosis: Difficulty in walking, not elsewhere classified (R26.2)     Time: 3403-5248 PT Time Calculation (min) (ACUTE ONLY): 37 min  Charges:  $Gait Training: 8-22 mins $Therapeutic Exercise: 8-22 mins                     Justin Mcdaniel PT Acute Rehabilitation Services Pager [phone] Office [phone]    Justin Mcdaniel 01/06/2022, 1:20 PM

## 2022-01-06 NOTE — Plan of Care (Signed)
Pt stable at this time. No needs at this time. Pt ready for d/c.

## 2022-01-06 NOTE — Progress Notes (Signed)
Pt stable at time of d/c instructions and education. Pt dressing clean, dry, and intact. No needs at time of d/c.

## 2022-01-06 NOTE — Discharge Summary (Signed)
Patient ID: Justin Mcdaniel MRN: [id number] DOB/AGE: April 02, 1955 66 y.o.  Admit date: 01/05/2022 Discharge date: 01/06/2022  Admission Diagnoses:  Principal Problem:   Unilateral primary osteoarthritis, right hip Active Problems:   Status post total replacement of right hip   Discharge Diagnoses:  Same  Past Medical History:  Diagnosis Date   Arthritis    Pleural effusion    Pre-diabetes     Surgeries: Procedure(s): RIGHT TOTAL HIP ARTHROPLASTY ANTERIOR APPROACH on 01/05/2022   Consultants:   Discharged Condition: Improved  Hospital Course: Justin Mcdaniel is an 66 y.o. male who was admitted 01/05/2022 for operative treatment ofUnilateral primary osteoarthritis, right hip. Patient has severe unremitting pain that affects sleep, daily activities, and work/hobbies. After pre-op clearance the patient was taken to the operating room on 01/05/2022 and underwent  Procedure(s): RIGHT TOTAL HIP ARTHROPLASTY ANTERIOR APPROACH.    Patient was given perioperative antibiotics:  Anti-infectives (From admission, onward)    Start     Dose/Rate Route Frequency Ordered Stop   01/05/22 1600  ceFAZolin (ANCEF) IVPB 1 g/50 mL premix        1 g 100 mL/hr over 30 Minutes Intravenous Every 6 hours 01/05/22 1311 01/05/22 2112   01/05/22 0730  ceFAZolin (ANCEF) IVPB 2g/100 mL premix        2 g 200 mL/hr over 30 Minutes Intravenous On call to O.R. 01/05/22 0720 01/05/22 0943        Patient was given sequential compression devices, early ambulation, and chemoprophylaxis to prevent DVT.  Patient benefited maximally from hospital stay and there were no complications.    Recent vital signs: Patient Vitals for the past 24 hrs:  BP Temp Temp src Pulse Resp SpO2  01/06/22 0924 113/75 98.8 F (37.1 C) Oral 78 16 94 %  01/06/22 0500 100/69 98.5 F (36.9 C) Oral 89 16 99 %  01/06/22 0129 128/80 98.7 F (37.1 C) Oral 81 18 98 %  01/05/22 2059 134/88 97.6 F (36.4 C) Oral 99 16 96 %  01/05/22 1740  (!) 146/91 98 F (36.7 C) Oral 99 18 97 %  01/05/22 1503 (!) 141/80 97.7 F (36.5 C) Oral 94 17 98 %  01/05/22 1318 (!) 149/80 97.7 F (36.5 C) Axillary 62 15 97 %  01/05/22 1300 (!) 142/91 -- -- 63 (!) 21 100 %  01/05/22 1245 129/85 -- -- (!) 56 11 100 %  01/05/22 1230 132/87 -- -- 60 14 99 %  01/05/22 1215 133/77 -- -- 60 16 100 %  01/05/22 1200 124/80 -- -- (!) 57 13 100 %  01/05/22 1145 114/70 -- -- (!) 54 11 100 %  01/05/22 1130 114/80 -- -- 63 16 98 %  01/05/22 1115 98/77 -- -- 72 20 98 %  01/05/22 1104 (!) 110/96 97.6 F (36.4 C) -- 71 18 100 %     Recent laboratory studies:  Recent Labs    01/06/22 0335  WBC 19.9*  HGB 11.8*  HCT 34.3*  PLT 227  NA 138  K 4.0  CL 105  CO2 27  BUN 16  CREATININE 0.90  GLUCOSE 154*  CALCIUM 9.0     Discharge Medications:   Allergies as of 01/06/2022   No Known Allergies      Medication List     STOP taking these medications    aspirin EC 81 MG tablet Replaced by: aspirin 81 MG chewable tablet       TAKE these medications    acetaminophen 650 MG CR  tablet Commonly known as: TYLENOL Take 1,300 mg by mouth 2 (two) times daily.   amLODipine 5 MG tablet Commonly known as: NORVASC Take 5 mg by mouth daily.   aspirin 81 MG chewable tablet Chew 1 tablet (81 mg total) by mouth 2 (two) times daily. Replaces: aspirin EC 81 MG tablet   celecoxib 200 MG capsule Commonly known as: CELEBREX Take 1 capsule (200 mg total) by mouth 2 (two) times daily between meals as needed.   metFORMIN 750 MG 24 hr tablet Commonly known as: GLUCOPHAGE-XR Take 750 mg by mouth 2 (two) times daily.   methocarbamol 500 MG tablet Commonly known as: ROBAXIN Take 1 tablet (500 mg total) by mouth every 6 (six) hours as needed for muscle spasms.   nitroGLYCERIN 0.4 MG SL tablet Commonly known as: NITROSTAT Place 0.4 mg under the tongue every 5 (five) minutes as needed for chest pain.   nitroGLYCERIN 0.2 mg/hr patch Commonly known as:  NITRODUR - Dosed in mg/24 hr Use 1/4 patch daily to the affected area.   oxyCODONE 5 MG immediate release tablet Commonly known as: Oxy IR/ROXICODONE Take 1-2 tablets (5-10 mg total) by mouth every 4 (four) hours as needed for moderate pain (pain score 4-6).   rosuvastatin 20 MG tablet Commonly known as: CRESTOR Take 20 mg by mouth daily.   zolpidem 10 MG tablet Commonly known as: AMBIEN Take 5-10 mg by mouth at bedtime as needed for sleep.               Durable Medical Equipment  (From admission, onward)           Start     Ordered   01/05/22 1312  DME 3 n 1  Once        01/05/22 1311   01/05/22 1312  DME Walker rolling  Once       Question Answer Comment  Walker: With 5 Inch Wheels   Patient needs a walker to treat with the following condition Status post total replacement of right hip      01/05/22 1311            Diagnostic Studies: DG Pelvis Portable  Result Date: 01/05/2022 CLINICAL DATA:  Status post total hip replacement. EXAM: PORTABLE PELVIS 1-2 VIEWS COMPARISON:  None Available. FINDINGS: Right hip arthroplasty in expected alignment. No periprosthetic lucency or fracture. Recent postsurgical change includes air and edema in the soft tissues. Lateral skin staples in place. IMPRESSION: Right hip arthroplasty without immediate postoperative complication. Electronically Signed   By: Keith Rake M.D.   On: 01/05/2022 12:41   DG HIP UNILAT WITH PELVIS 2-3 VIEWS RIGHT  Result Date: 01/05/2022 CLINICAL DATA:  Total right hip arthroplasty. EXAM: DG HIP (WITH OR WITHOUT PELVIS) 2-3V RIGHT COMPARISON:  Right hip radiographs 08/22/2021 FINDINGS: Images were performed intraoperatively without the presence of a radiologist. Interval total right hip arthroplasty. No hardware complication is seen. Total fluoroscopy images: 3 Total fluoroscopy time: 23 seconds Total dose: Radiation Exposure Index (as provided by the fluoroscopic device): 1.458 mGy air Kerma Please  see intraoperative findings for further detail. IMPRESSION: Intraoperative images during total right hip arthroplasty. Electronically Signed   By: Yvonne Kendall M.D.   On: 01/05/2022 11:29   DG C-Arm 1-60 Min-No Report  Result Date: 01/05/2022 Fluoroscopy was utilized by the requesting physician.  No radiographic interpretation.    Disposition: Discharge disposition: 01-Home or Self Care          Follow-up Information  [facility].   Specialty: [place] Why: to provide home physical therapy visits Contact information: [address] [place] [address] [phone]         Mcarthur Rossetti, MD Follow up in 2 week(s).   Specialty: Orthopedic Surgery Contact information: [address] [place] [address] [phone]                  Signed: Mcarthur Rossetti 01/06/2022, 10:12 AM

## 2022-01-07 DIAGNOSIS — Z96641 Presence of right artificial hip joint: Secondary | ICD-10-CM | POA: Diagnosis not present

## 2022-01-07 DIAGNOSIS — R7303 Prediabetes: Secondary | ICD-10-CM | POA: Diagnosis not present

## 2022-01-07 DIAGNOSIS — Z87891 Personal history of nicotine dependence: Secondary | ICD-10-CM | POA: Diagnosis not present

## 2022-01-07 DIAGNOSIS — M5441 Lumbago with sciatica, right side: Secondary | ICD-10-CM | POA: Diagnosis not present

## 2022-01-07 DIAGNOSIS — Z791 Long term (current) use of non-steroidal anti-inflammatories (NSAID): Secondary | ICD-10-CM | POA: Diagnosis not present

## 2022-01-07 DIAGNOSIS — Z471 Aftercare following joint replacement surgery: Secondary | ICD-10-CM | POA: Diagnosis not present

## 2022-01-07 DIAGNOSIS — Z7982 Long term (current) use of aspirin: Secondary | ICD-10-CM | POA: Diagnosis not present

## 2022-01-07 DIAGNOSIS — E785 Hyperlipidemia, unspecified: Secondary | ICD-10-CM | POA: Diagnosis not present

## 2022-01-07 DIAGNOSIS — G8929 Other chronic pain: Secondary | ICD-10-CM | POA: Diagnosis not present

## 2022-01-07 DIAGNOSIS — Z7984 Long term (current) use of oral hypoglycemic drugs: Secondary | ICD-10-CM | POA: Diagnosis not present

## 2022-01-07 DIAGNOSIS — Z9181 History of falling: Secondary | ICD-10-CM | POA: Diagnosis not present

## 2022-01-08 ENCOUNTER — Encounter (HOSPITAL_COMMUNITY): Payer: Self-pay | Admitting: Orthopaedic Surgery

## 2022-01-09 DIAGNOSIS — M5441 Lumbago with sciatica, right side: Secondary | ICD-10-CM | POA: Diagnosis not present

## 2022-01-09 DIAGNOSIS — Z9181 History of falling: Secondary | ICD-10-CM | POA: Diagnosis not present

## 2022-01-09 DIAGNOSIS — Z791 Long term (current) use of non-steroidal anti-inflammatories (NSAID): Secondary | ICD-10-CM | POA: Diagnosis not present

## 2022-01-09 DIAGNOSIS — Z7984 Long term (current) use of oral hypoglycemic drugs: Secondary | ICD-10-CM | POA: Diagnosis not present

## 2022-01-09 DIAGNOSIS — Z7982 Long term (current) use of aspirin: Secondary | ICD-10-CM | POA: Diagnosis not present

## 2022-01-09 DIAGNOSIS — R7303 Prediabetes: Secondary | ICD-10-CM | POA: Diagnosis not present

## 2022-01-09 DIAGNOSIS — E785 Hyperlipidemia, unspecified: Secondary | ICD-10-CM | POA: Diagnosis not present

## 2022-01-09 DIAGNOSIS — Z471 Aftercare following joint replacement surgery: Secondary | ICD-10-CM | POA: Diagnosis not present

## 2022-01-09 DIAGNOSIS — G8929 Other chronic pain: Secondary | ICD-10-CM | POA: Diagnosis not present

## 2022-01-09 DIAGNOSIS — Z96641 Presence of right artificial hip joint: Secondary | ICD-10-CM | POA: Diagnosis not present

## 2022-01-09 DIAGNOSIS — Z87891 Personal history of nicotine dependence: Secondary | ICD-10-CM | POA: Diagnosis not present

## 2022-01-12 DIAGNOSIS — Z9181 History of falling: Secondary | ICD-10-CM | POA: Diagnosis not present

## 2022-01-12 DIAGNOSIS — Z96641 Presence of right artificial hip joint: Secondary | ICD-10-CM | POA: Diagnosis not present

## 2022-01-12 DIAGNOSIS — E785 Hyperlipidemia, unspecified: Secondary | ICD-10-CM | POA: Diagnosis not present

## 2022-01-12 DIAGNOSIS — G8929 Other chronic pain: Secondary | ICD-10-CM | POA: Diagnosis not present

## 2022-01-12 DIAGNOSIS — Z791 Long term (current) use of non-steroidal anti-inflammatories (NSAID): Secondary | ICD-10-CM | POA: Diagnosis not present

## 2022-01-12 DIAGNOSIS — Z471 Aftercare following joint replacement surgery: Secondary | ICD-10-CM | POA: Diagnosis not present

## 2022-01-12 DIAGNOSIS — Z87891 Personal history of nicotine dependence: Secondary | ICD-10-CM | POA: Diagnosis not present

## 2022-01-12 DIAGNOSIS — R7303 Prediabetes: Secondary | ICD-10-CM | POA: Diagnosis not present

## 2022-01-12 DIAGNOSIS — M5441 Lumbago with sciatica, right side: Secondary | ICD-10-CM | POA: Diagnosis not present

## 2022-01-12 DIAGNOSIS — Z7982 Long term (current) use of aspirin: Secondary | ICD-10-CM | POA: Diagnosis not present

## 2022-01-12 DIAGNOSIS — Z7984 Long term (current) use of oral hypoglycemic drugs: Secondary | ICD-10-CM | POA: Diagnosis not present

## 2022-01-15 DIAGNOSIS — Z791 Long term (current) use of non-steroidal anti-inflammatories (NSAID): Secondary | ICD-10-CM | POA: Diagnosis not present

## 2022-01-15 DIAGNOSIS — Z7982 Long term (current) use of aspirin: Secondary | ICD-10-CM | POA: Diagnosis not present

## 2022-01-15 DIAGNOSIS — Z9181 History of falling: Secondary | ICD-10-CM | POA: Diagnosis not present

## 2022-01-15 DIAGNOSIS — Z96641 Presence of right artificial hip joint: Secondary | ICD-10-CM | POA: Diagnosis not present

## 2022-01-15 DIAGNOSIS — Z87891 Personal history of nicotine dependence: Secondary | ICD-10-CM | POA: Diagnosis not present

## 2022-01-15 DIAGNOSIS — E785 Hyperlipidemia, unspecified: Secondary | ICD-10-CM | POA: Diagnosis not present

## 2022-01-15 DIAGNOSIS — Z471 Aftercare following joint replacement surgery: Secondary | ICD-10-CM | POA: Diagnosis not present

## 2022-01-15 DIAGNOSIS — Z7984 Long term (current) use of oral hypoglycemic drugs: Secondary | ICD-10-CM | POA: Diagnosis not present

## 2022-01-15 DIAGNOSIS — G8929 Other chronic pain: Secondary | ICD-10-CM | POA: Diagnosis not present

## 2022-01-15 DIAGNOSIS — M5441 Lumbago with sciatica, right side: Secondary | ICD-10-CM | POA: Diagnosis not present

## 2022-01-15 DIAGNOSIS — R7303 Prediabetes: Secondary | ICD-10-CM | POA: Diagnosis not present

## 2022-01-17 DIAGNOSIS — M5441 Lumbago with sciatica, right side: Secondary | ICD-10-CM | POA: Diagnosis not present

## 2022-01-17 DIAGNOSIS — R7303 Prediabetes: Secondary | ICD-10-CM | POA: Diagnosis not present

## 2022-01-17 DIAGNOSIS — Z7982 Long term (current) use of aspirin: Secondary | ICD-10-CM | POA: Diagnosis not present

## 2022-01-17 DIAGNOSIS — Z471 Aftercare following joint replacement surgery: Secondary | ICD-10-CM | POA: Diagnosis not present

## 2022-01-17 DIAGNOSIS — G8929 Other chronic pain: Secondary | ICD-10-CM | POA: Diagnosis not present

## 2022-01-17 DIAGNOSIS — Z791 Long term (current) use of non-steroidal anti-inflammatories (NSAID): Secondary | ICD-10-CM | POA: Diagnosis not present

## 2022-01-17 DIAGNOSIS — Z87891 Personal history of nicotine dependence: Secondary | ICD-10-CM | POA: Diagnosis not present

## 2022-01-17 DIAGNOSIS — Z7984 Long term (current) use of oral hypoglycemic drugs: Secondary | ICD-10-CM | POA: Diagnosis not present

## 2022-01-17 DIAGNOSIS — Z96641 Presence of right artificial hip joint: Secondary | ICD-10-CM | POA: Diagnosis not present

## 2022-01-17 DIAGNOSIS — E785 Hyperlipidemia, unspecified: Secondary | ICD-10-CM | POA: Diagnosis not present

## 2022-01-17 DIAGNOSIS — Z9181 History of falling: Secondary | ICD-10-CM | POA: Diagnosis not present

## 2022-01-18 ENCOUNTER — Ambulatory Visit (INDEPENDENT_AMBULATORY_CARE_PROVIDER_SITE_OTHER): Payer: Medicare HMO | Admitting: Orthopaedic Surgery

## 2022-01-18 ENCOUNTER — Encounter: Payer: Self-pay | Admitting: Orthopaedic Surgery

## 2022-01-18 DIAGNOSIS — Z96641 Presence of right artificial hip joint: Secondary | ICD-10-CM

## 2022-01-18 NOTE — Progress Notes (Signed)
Justin Mcdaniel is 2 weeks tomorrow status post a right total hip arthroplasty.  He is very active 66 year old gentleman.  He has been compliant with his baby aspirin twice a day and not taking pain medications.  He is not walking with any assistive device and doing well overall.  He has been compliant with compressive hose as well.  He was on a baby aspirin once a day prior to surgery and he understands he can go back on his baby aspirin.  His right hip incision looks good.  We remove the staples and place Steri-Strips.  He did have a moderate seroma and I did aspirate about 100 cc of fluid off of the hip surrounding tissue.  It was not uncomfortable with the seroma but he understands why we wanted to remove it.  He will continue increase his activities as comfort allows.  I will see him back in 4 weeks to see how he is doing overall but no x-rays are needed.

## 2022-01-19 DIAGNOSIS — Z87891 Personal history of nicotine dependence: Secondary | ICD-10-CM | POA: Diagnosis not present

## 2022-01-19 DIAGNOSIS — Z7982 Long term (current) use of aspirin: Secondary | ICD-10-CM | POA: Diagnosis not present

## 2022-01-19 DIAGNOSIS — R7303 Prediabetes: Secondary | ICD-10-CM | POA: Diagnosis not present

## 2022-01-19 DIAGNOSIS — Z96641 Presence of right artificial hip joint: Secondary | ICD-10-CM | POA: Diagnosis not present

## 2022-01-19 DIAGNOSIS — G8929 Other chronic pain: Secondary | ICD-10-CM | POA: Diagnosis not present

## 2022-01-19 DIAGNOSIS — Z9181 History of falling: Secondary | ICD-10-CM | POA: Diagnosis not present

## 2022-01-19 DIAGNOSIS — Z471 Aftercare following joint replacement surgery: Secondary | ICD-10-CM | POA: Diagnosis not present

## 2022-01-19 DIAGNOSIS — E785 Hyperlipidemia, unspecified: Secondary | ICD-10-CM | POA: Diagnosis not present

## 2022-01-19 DIAGNOSIS — Z7984 Long term (current) use of oral hypoglycemic drugs: Secondary | ICD-10-CM | POA: Diagnosis not present

## 2022-01-19 DIAGNOSIS — M5441 Lumbago with sciatica, right side: Secondary | ICD-10-CM | POA: Diagnosis not present

## 2022-01-19 DIAGNOSIS — Z791 Long term (current) use of non-steroidal anti-inflammatories (NSAID): Secondary | ICD-10-CM | POA: Diagnosis not present

## 2022-02-15 ENCOUNTER — Ambulatory Visit: Payer: Medicare HMO | Admitting: Orthopaedic Surgery

## 2022-02-15 ENCOUNTER — Encounter: Payer: Self-pay | Admitting: Orthopaedic Surgery

## 2022-02-15 DIAGNOSIS — Z96641 Presence of right artificial hip joint: Secondary | ICD-10-CM

## 2022-02-15 NOTE — Progress Notes (Signed)
Justin Mcdaniel is here today at 6 weeks status post a right total hip arthroplasty.  He said he is doing great and has no issues at all.  He does get occasional clunk in the hip when he is powerwalking.  On exam his right hip moves smoothly and fluidly.  There is still some swelling to be expected at his incision but overall things look good.  He has fluid range of motion of that hip.  He will continue to brace his activities with no restrictions other than the running.  I will see him back in 6 months with a standing AP pelvis and lateral of his right operative hip.  If there are any issues before then he will let us know.

## 2022-04-14 DIAGNOSIS — R69 Illness, unspecified: Secondary | ICD-10-CM | POA: Diagnosis not present

## 2022-05-16 DIAGNOSIS — D485 Neoplasm of uncertain behavior of skin: Secondary | ICD-10-CM | POA: Diagnosis not present

## 2022-05-16 DIAGNOSIS — L821 Other seborrheic keratosis: Secondary | ICD-10-CM | POA: Diagnosis not present

## 2022-05-16 DIAGNOSIS — L720 Epidermal cyst: Secondary | ICD-10-CM | POA: Diagnosis not present

## 2022-05-16 DIAGNOSIS — D179 Benign lipomatous neoplasm, unspecified: Secondary | ICD-10-CM | POA: Diagnosis not present

## 2022-05-16 DIAGNOSIS — C44311 Basal cell carcinoma of skin of nose: Secondary | ICD-10-CM | POA: Diagnosis not present

## 2022-05-16 DIAGNOSIS — R208 Other disturbances of skin sensation: Secondary | ICD-10-CM | POA: Diagnosis not present

## 2022-05-23 DIAGNOSIS — C44311 Basal cell carcinoma of skin of nose: Secondary | ICD-10-CM | POA: Diagnosis not present

## 2022-05-24 ENCOUNTER — Encounter: Payer: Self-pay | Admitting: Radiology

## 2022-05-28 DIAGNOSIS — Z7189 Other specified counseling: Secondary | ICD-10-CM | POA: Diagnosis not present

## 2022-05-28 DIAGNOSIS — Z08 Encounter for follow-up examination after completed treatment for malignant neoplasm: Secondary | ICD-10-CM | POA: Diagnosis not present

## 2022-05-28 DIAGNOSIS — Z85828 Personal history of other malignant neoplasm of skin: Secondary | ICD-10-CM | POA: Diagnosis not present

## 2022-05-28 DIAGNOSIS — L720 Epidermal cyst: Secondary | ICD-10-CM | POA: Diagnosis not present

## 2022-05-28 DIAGNOSIS — D1801 Hemangioma of skin and subcutaneous tissue: Secondary | ICD-10-CM | POA: Diagnosis not present

## 2022-05-28 DIAGNOSIS — D225 Melanocytic nevi of trunk: Secondary | ICD-10-CM | POA: Diagnosis not present

## 2022-05-28 DIAGNOSIS — L821 Other seborrheic keratosis: Secondary | ICD-10-CM | POA: Diagnosis not present

## 2022-05-28 DIAGNOSIS — L738 Other specified follicular disorders: Secondary | ICD-10-CM | POA: Diagnosis not present

## 2022-05-28 DIAGNOSIS — L814 Other melanin hyperpigmentation: Secondary | ICD-10-CM | POA: Diagnosis not present

## 2022-06-06 DIAGNOSIS — R69 Illness, unspecified: Secondary | ICD-10-CM | POA: Diagnosis not present

## 2022-07-05 DIAGNOSIS — R7309 Other abnormal glucose: Secondary | ICD-10-CM | POA: Diagnosis not present

## 2022-07-05 DIAGNOSIS — Z125 Encounter for screening for malignant neoplasm of prostate: Secondary | ICD-10-CM | POA: Diagnosis not present

## 2022-07-05 DIAGNOSIS — I2510 Atherosclerotic heart disease of native coronary artery without angina pectoris: Secondary | ICD-10-CM | POA: Diagnosis not present

## 2022-07-10 DIAGNOSIS — I10 Essential (primary) hypertension: Secondary | ICD-10-CM | POA: Diagnosis not present

## 2022-07-10 DIAGNOSIS — J309 Allergic rhinitis, unspecified: Secondary | ICD-10-CM | POA: Diagnosis not present

## 2022-07-10 DIAGNOSIS — J438 Other emphysema: Secondary | ICD-10-CM | POA: Diagnosis not present

## 2022-07-10 DIAGNOSIS — J8410 Pulmonary fibrosis, unspecified: Secondary | ICD-10-CM | POA: Diagnosis not present

## 2022-07-10 DIAGNOSIS — I2510 Atherosclerotic heart disease of native coronary artery without angina pectoris: Secondary | ICD-10-CM | POA: Diagnosis not present

## 2022-07-10 DIAGNOSIS — E7800 Pure hypercholesterolemia, unspecified: Secondary | ICD-10-CM | POA: Diagnosis not present

## 2022-07-10 DIAGNOSIS — Z122 Encounter for screening for malignant neoplasm of respiratory organs: Secondary | ICD-10-CM | POA: Diagnosis not present

## 2022-07-10 DIAGNOSIS — E1169 Type 2 diabetes mellitus with other specified complication: Secondary | ICD-10-CM | POA: Diagnosis not present

## 2022-07-10 DIAGNOSIS — Z0000 Encounter for general adult medical examination without abnormal findings: Secondary | ICD-10-CM | POA: Diagnosis not present

## 2022-08-06 ENCOUNTER — Encounter: Payer: Self-pay | Admitting: Orthopaedic Surgery

## 2022-08-06 ENCOUNTER — Ambulatory Visit: Payer: Medicare HMO | Admitting: Orthopaedic Surgery

## 2022-08-06 ENCOUNTER — Other Ambulatory Visit (INDEPENDENT_AMBULATORY_CARE_PROVIDER_SITE_OTHER): Payer: Medicare HMO

## 2022-08-06 DIAGNOSIS — Z96641 Presence of right artificial hip joint: Secondary | ICD-10-CM

## 2022-08-06 NOTE — Progress Notes (Signed)
The patient is now 7 months status post a right total hip arthroplasty.  He is doing very well and has no issues at all or complaints.  He denies any left hip pain.  He has good range of motion and strength and would like to start running.  On exam his left and right hips both moves equally and smoothly with no difficulty at all.  His right operative hip feels stable.  An AP pelvis and lateral of the right hip shows a well-seated total hip arthroplasty that is bone ingrown.  His left hip only shows just slight flattening of the superolateral femoral head.  Overall he looks great.  From my standpoint follow-up can now be as needed.  If he does develop any issues with his right or left hip or other joints he knows to let us know.  All questions and concerns were addressed and answered.

## 2022-08-16 ENCOUNTER — Ambulatory Visit: Payer: Medicare HMO | Admitting: Orthopaedic Surgery

## 2022-08-16 DIAGNOSIS — R69 Illness, unspecified: Secondary | ICD-10-CM | POA: Diagnosis not present

## 2022-09-05 DIAGNOSIS — L905 Scar conditions and fibrosis of skin: Secondary | ICD-10-CM | POA: Diagnosis not present

## 2022-09-10 DIAGNOSIS — R69 Illness, unspecified: Secondary | ICD-10-CM | POA: Diagnosis not present

## 2022-09-19 ENCOUNTER — Ambulatory Visit: Payer: Medicare HMO | Admitting: Cardiovascular Disease

## 2022-10-10 DIAGNOSIS — R69 Illness, unspecified: Secondary | ICD-10-CM | POA: Diagnosis not present

## 2022-11-07 ENCOUNTER — Ambulatory Visit: Payer: Medicare HMO | Admitting: Cardiovascular Disease

## 2022-11-07 ENCOUNTER — Encounter: Payer: Self-pay | Admitting: Cardiovascular Disease

## 2022-11-07 VITALS — BP 120/84 | HR 50 | Ht 70.5 in | Wt 204.4 lb

## 2022-11-07 DIAGNOSIS — Z8249 Family history of ischemic heart disease and other diseases of the circulatory system: Secondary | ICD-10-CM

## 2022-11-07 DIAGNOSIS — E782 Mixed hyperlipidemia: Secondary | ICD-10-CM | POA: Diagnosis not present

## 2022-11-07 DIAGNOSIS — R7309 Other abnormal glucose: Secondary | ICD-10-CM | POA: Diagnosis not present

## 2022-11-07 DIAGNOSIS — R931 Abnormal findings on diagnostic imaging of heart and coronary circulation: Secondary | ICD-10-CM

## 2022-11-07 NOTE — Progress Notes (Signed)
11/07/2022 Justin Mcdaniel   1955-05-03  [id number]  Primary Physician Merri Brunette, MD Primary Cardiologist: Runell Gess MD Nicholes Calamity, [place]  HPI:  Justin Mcdaniel is a 67 y.o.   is a thin and fit appearing married Caucasian male father of 4 children, grandfather of 5 grandchildren is been retired from working at [employer], formally [employer].  He was referred to me by Dr. Renne Crigler, his PCP, because of an elevated coronary calcium score.  I apparently saw him remotely for cardiac evaluation because of an abnormal EKG. I last saw him in the office 09/04/2019.  His risk factors include family history with a brother who had multiple stents and a mother who has had stents in an elderly age.  He smoked remotely, stopped in 2012 after smoking a pack a day for 25 years.  He has untreated hyperlipidemia.  Is never had a heart attack or stroke.  He is fairly active and runs 4 to 5 miles a day.  Recent coronary calcium score performed at [facility] on 07/27/2019 was 1951 with calcium in all 3 coronary arteries.  Recent 2D echocardiogram performed 08/06/2019 revealed normal LV systolic function without any valvular abnormalities.  He had a gated SPECT study performed 09/16/2019 that was entirely normal.  He continues to be active.  He underwent right total hip replacement by Dr. Allie Bossier 01/05/2022 which he has recuperated from.  He is running but not as actively as he was prehip replacement.  He denies chest pain or shortness of breath.  His most recent lipid profile performed 07/06/2022 revealed total cholesterol 145, LDL 48, HDL 68 and triglyceride level of 143.   Current Meds  Medication Sig   amLODipine (NORVASC) 5 MG tablet Take 5 mg by mouth daily.   metFORMIN (GLUCOPHAGE-XR) 750 MG 24 hr tablet Take 750 mg by mouth 2 (two) times daily.   rosuvastatin (CRESTOR) 20 MG tablet Take 20 mg by mouth daily.   zolpidem (AMBIEN) 10 MG tablet Take 5-10 mg by mouth at bedtime as needed for sleep.      No Known Allergies  Social History   Socioeconomic History   Marital status: Married    Spouse name: Not on file   Number of children: Not on file   Years of education: Not on file   Highest education level: Not on file  Occupational History   Not on file  Tobacco Use   Smoking status: Former    Current packs/day: 0.00    Average packs/day: 1.5 packs/day for 20.0 years (30.0 ttl pk-yrs)    Types: Cigarettes    Start date: 08/07/1990    Quit date: 08/07/2010    Years since quitting: 12.2   Smokeless tobacco: Never  Vaping Use   Vaping status: Never Used  Substance and Sexual Activity   Alcohol use: Yes    Alcohol/week: 2.0 standard drinks of alcohol    Types: 2 Cans of beer per week   Drug use: Never   Sexual activity: Not on file  Other Topics Concern   Not on file  Social History Narrative   Not on file   Social Determinants of Health   Financial Resource Strain: Not on file  Food Insecurity: No Food Insecurity (01/05/2022)   Hunger Vital Sign    Worried About Running Out of Food in the Last Year: Never true    Ran Out of Food in the Last Year: Never true  Transportation Needs: No Transportation  Needs (01/05/2022)   PRAPARE - Administrator, Civil Service (Medical): No    Lack of Transportation (Non-Medical): No  Physical Activity: Not on file  Stress: Not on file  Social Connections: Unknown (08/01/2021)   [facility]   Social Network    Social Network: Not on file  Intimate Partner Violence: Not At Risk (01/05/2022)   Humiliation, Afraid, Rape, and Kick questionnaire    Fear of Current or Ex-Partner: No    Emotionally Abused: No    Physically Abused: No    Sexually Abused: No     Review of Systems: General: negative for chills, fever, night sweats or weight changes.  Cardiovascular: negative for chest pain, dyspnea on exertion, edema, orthopnea, palpitations, paroxysmal nocturnal dyspnea or shortness of  breath Dermatological: negative for rash Respiratory: negative for cough or wheezing Urologic: negative for hematuria Abdominal: negative for nausea, vomiting, diarrhea, bright red blood per rectum, melena, or hematemesis Neurologic: negative for visual changes, syncope, or dizziness All other systems reviewed and are otherwise negative except as noted above.    Blood pressure 120/84, pulse (!) 50, height 5' 10.5" (1.791 m), weight 204 lb 6.4 oz (92.7 kg), SpO2 96%.  General appearance: alert and no distress Neck: no adenopathy, no carotid bruit, no JVD, supple, symmetrical, trachea midline, and thyroid not enlarged, symmetric, no tenderness/mass/nodules Lungs: clear to auscultation bilaterally Heart: regular rate and rhythm, S1, S2 normal, no murmur, click, rub or gallop Extremities: extremities normal, atraumatic, no cyanosis or edema Pulses: 2+ and symmetric Skin: Skin color, texture, turgor normal. No rashes or lesions Neurologic: Grossly normal  EKG EKG Interpretation Date/Time:  Wednesday November 07 2022 09:35:34 EDT Ventricular Rate:  50 PR Interval:  160 QRS Duration:  88 QT Interval:  448 QTC Calculation: 408 R Axis:   -16  Text Interpretation: Sinus bradycardia When compared with ECG of 27-Dec-2021 11:29, T wave inversion now evident in Inferior leads Confirmed by Nanetta Batty [id number]) on 11/07/2022 9:39:55 AM    ASSESSMENT AND PLAN:   Hyperlipidemia History of hyperlipidemia on rosuvastatin 20 mg a day with lipid profile performed/19/24 revealing total cholesterol 145, LDL 48, HDL 68 and a triglyceride level of 43, below goal for secondary prevention.  Family history of heart disease Family history of heart disease with mother that had stents and an older age, brother who had stents in the past and recently had bypass surgery.  Elevated coronary artery calcium score Elevated coronary calcium score of 1951 performed 07/27/2019.  Subsequent Myoview stress test  performed 09/16/2019 was nonischemic with normal LV function.  Patient is active and denies chest pain or shortness of breath.  He is below goal for secondary prevention on rosuvastatin.     Runell Gess MD FACP,FACC,FAHA, [facility] 11/07/2022 9:52 AM

## 2022-11-07 NOTE — Assessment & Plan Note (Signed)
Family history of heart disease with mother that had stents and an older age, brother who had stents in the past and recently had bypass surgery.

## 2022-11-07 NOTE — Assessment & Plan Note (Signed)
Elevated coronary calcium score of 1951 performed 07/27/2019.  Subsequent Myoview stress test performed 09/16/2019 was nonischemic with normal LV function.  Patient is active and denies chest pain or shortness of breath.  He is below goal for secondary prevention on rosuvastatin.

## 2022-11-07 NOTE — Assessment & Plan Note (Signed)
History of hyperlipidemia on rosuvastatin 20 mg a day with lipid profile performed/19/24 revealing total cholesterol 145, LDL 48, HDL 68 and a triglyceride level of 43, below goal for secondary prevention.

## 2022-11-07 NOTE — Patient Instructions (Signed)
Medication Instructions:  NO CHANGES  *If you need a refill on your cardiac medications before your next appointment, please call your pharmacy*   Follow-Up: At [facility], you and your health needs are our priority.  As part of our continuing mission to provide you with exceptional heart care, we have created designated Provider Care Teams.  These Care Teams include your primary Cardiologist (physician) and Advanced Practice Providers (APPs -  Physician Assistants and Nurse Practitioners) who all work together to provide you with the care you need, when you need it.  We recommend signing up for the patient portal called "MyChart".  Sign up information is provided on this After Visit Summary.  MyChart is used to connect with patients for Virtual Visits (Telemedicine).  Patients are able to view lab/test results, encounter notes, upcoming appointments, etc.  Non-urgent messages can be sent to your provider as well.   To learn more about what you can do with MyChart, go to [email or web address].    Your next appointment:    12 months with Dr. Gwenlyn Found

## 2022-11-08 DIAGNOSIS — E1169 Type 2 diabetes mellitus with other specified complication: Secondary | ICD-10-CM | POA: Diagnosis not present

## 2022-11-08 DIAGNOSIS — I2510 Atherosclerotic heart disease of native coronary artery without angina pectoris: Secondary | ICD-10-CM | POA: Diagnosis not present

## 2022-11-08 DIAGNOSIS — I10 Essential (primary) hypertension: Secondary | ICD-10-CM | POA: Diagnosis not present

## 2022-11-08 DIAGNOSIS — E7800 Pure hypercholesterolemia, unspecified: Secondary | ICD-10-CM | POA: Diagnosis not present

## 2022-11-29 DIAGNOSIS — Z85828 Personal history of other malignant neoplasm of skin: Secondary | ICD-10-CM | POA: Diagnosis not present

## 2022-11-29 DIAGNOSIS — Z7189 Other specified counseling: Secondary | ICD-10-CM | POA: Diagnosis not present

## 2022-11-29 DIAGNOSIS — Z08 Encounter for follow-up examination after completed treatment for malignant neoplasm: Secondary | ICD-10-CM | POA: Diagnosis not present

## 2022-11-29 DIAGNOSIS — L814 Other melanin hyperpigmentation: Secondary | ICD-10-CM | POA: Diagnosis not present

## 2022-11-29 DIAGNOSIS — L821 Other seborrheic keratosis: Secondary | ICD-10-CM | POA: Diagnosis not present

## 2022-11-29 DIAGNOSIS — D225 Melanocytic nevi of trunk: Secondary | ICD-10-CM | POA: Diagnosis not present

## 2023-01-04 IMAGING — CR DG HIP (WITH OR WITHOUT PELVIS) 2-3V*R*
2 series · 2 of 2 positions shown · non-contrast
Comparison: None Available.

CLINICAL DATA: Hip pain following running, initial encounter

EXAM:
DG HIP (WITH OR WITHOUT PELVIS) 2V RIGHT

[t hip ap right]
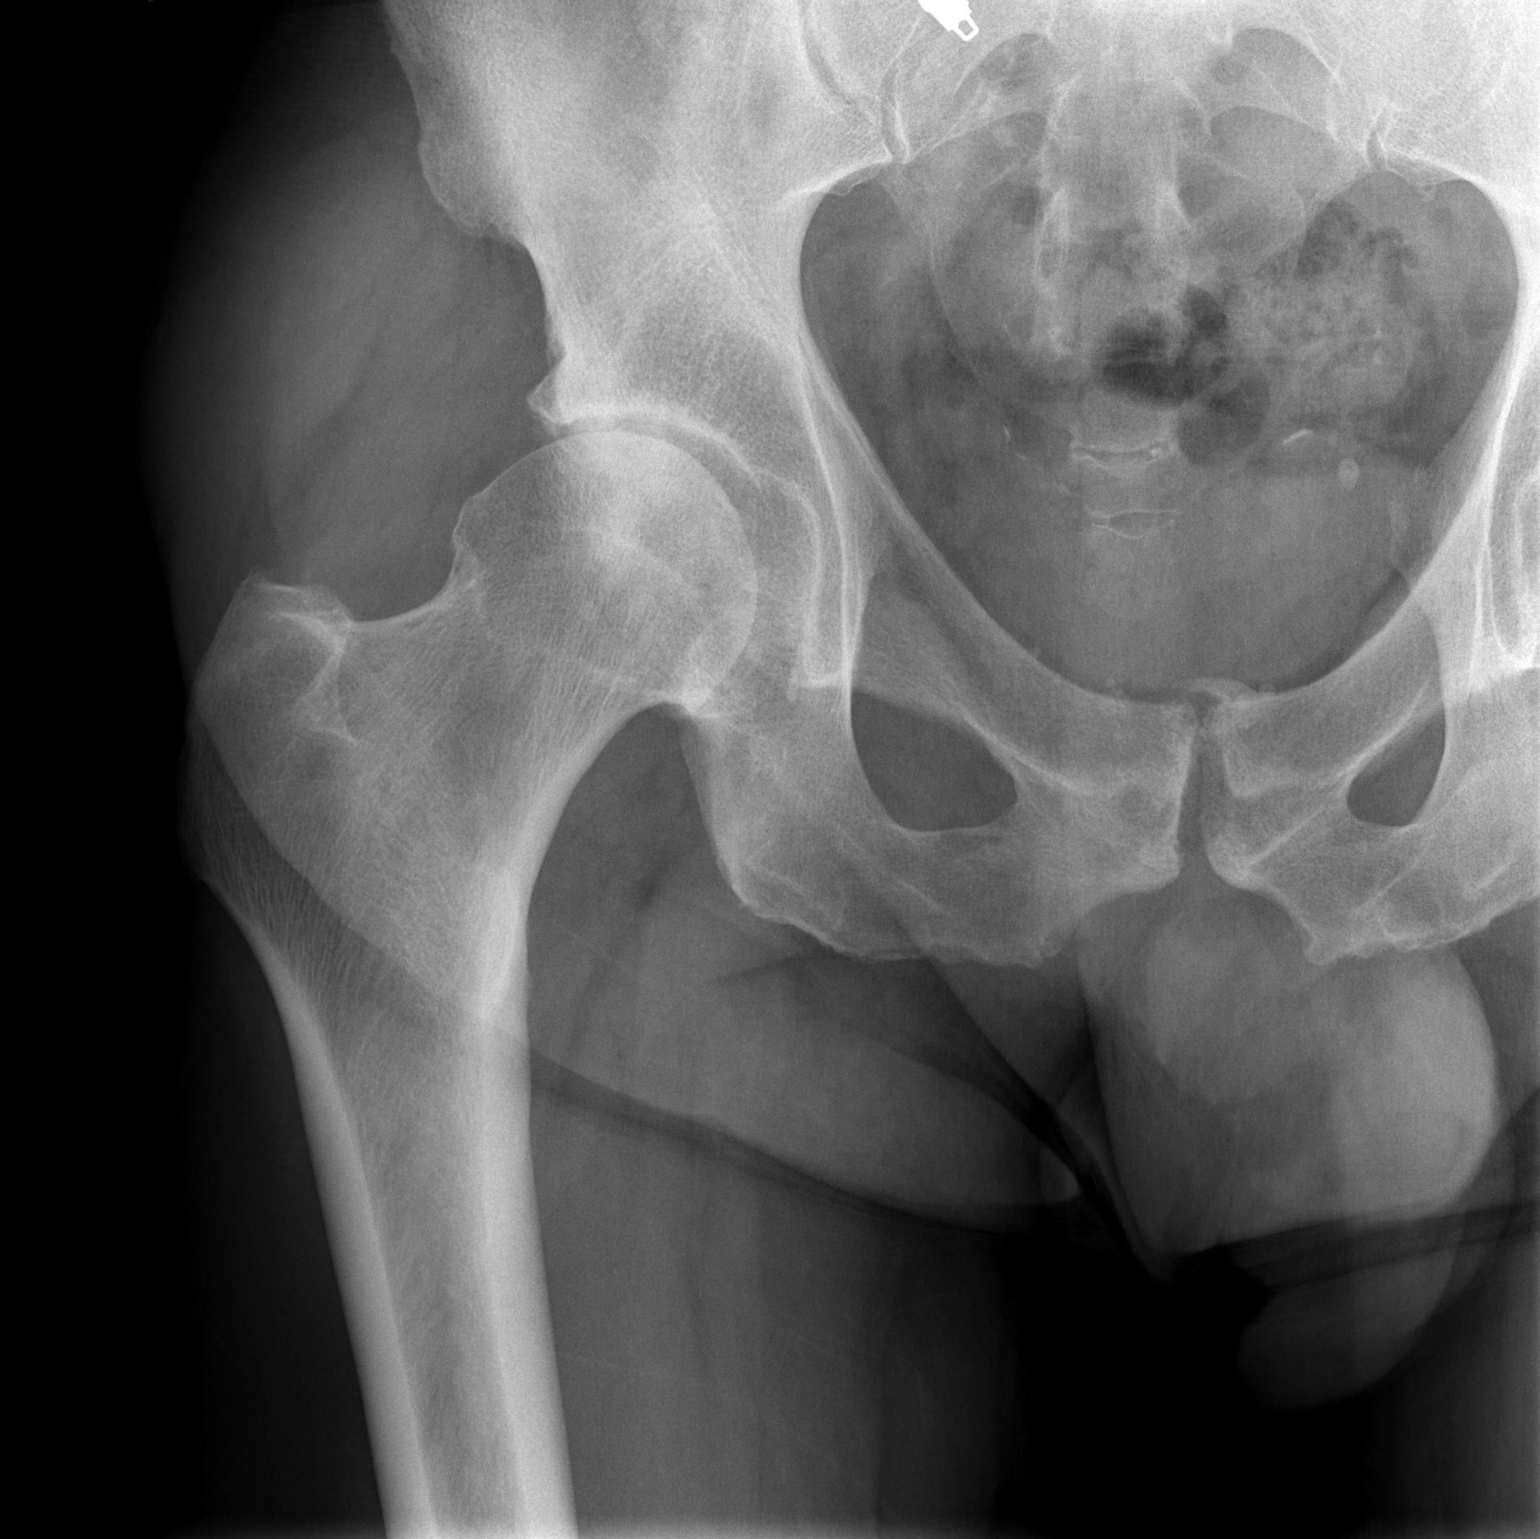

[t hip frog leg right]
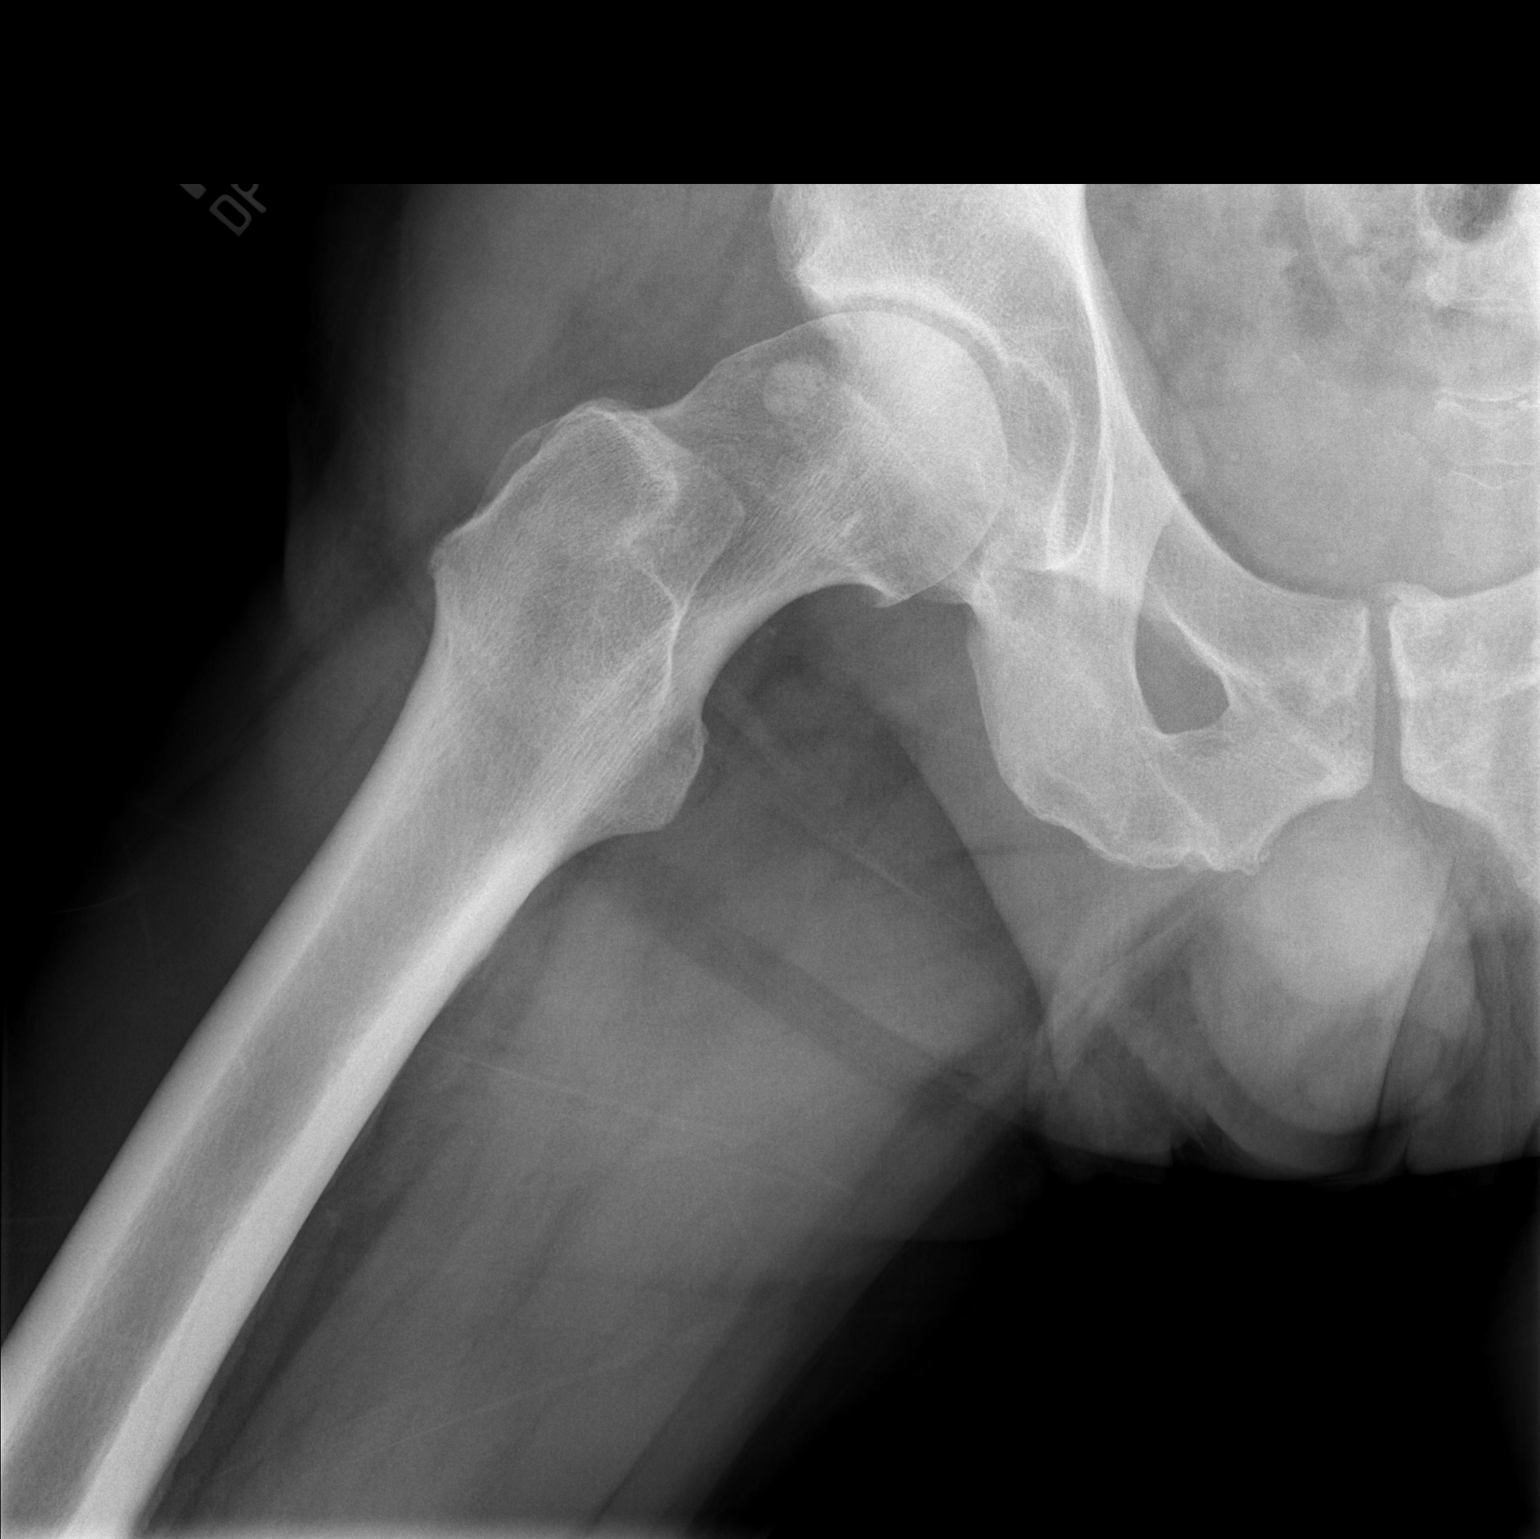

[2 of 2 positions shown; findings below may reference images not displayed]

FINDINGS: Visualized pelvic ring is intact. Mild prostatic calcifications are
seen. Degenerative changes of the right hip joint are noted.
Findings suspicious for cam type femoroacetabular impingement are
seen.
IMPRESSION: Findings suspicious for cam type femoroacetabular impingement. No
acute fracture is seen.

## 2023-01-08 DIAGNOSIS — H2513 Age-related nuclear cataract, bilateral: Secondary | ICD-10-CM | POA: Diagnosis not present

## 2023-05-29 DIAGNOSIS — L738 Other specified follicular disorders: Secondary | ICD-10-CM | POA: Diagnosis not present

## 2023-05-29 DIAGNOSIS — Z85828 Personal history of other malignant neoplasm of skin: Secondary | ICD-10-CM | POA: Diagnosis not present

## 2023-05-29 DIAGNOSIS — D225 Melanocytic nevi of trunk: Secondary | ICD-10-CM | POA: Diagnosis not present

## 2023-05-29 DIAGNOSIS — L700 Acne vulgaris: Secondary | ICD-10-CM | POA: Diagnosis not present

## 2023-05-29 DIAGNOSIS — Z7189 Other specified counseling: Secondary | ICD-10-CM | POA: Diagnosis not present

## 2023-05-29 DIAGNOSIS — L814 Other melanin hyperpigmentation: Secondary | ICD-10-CM | POA: Diagnosis not present

## 2023-05-29 DIAGNOSIS — L821 Other seborrheic keratosis: Secondary | ICD-10-CM | POA: Diagnosis not present

## 2023-05-29 DIAGNOSIS — Z08 Encounter for follow-up examination after completed treatment for malignant neoplasm: Secondary | ICD-10-CM | POA: Diagnosis not present

## 2023-07-09 DIAGNOSIS — E7800 Pure hypercholesterolemia, unspecified: Secondary | ICD-10-CM | POA: Diagnosis not present

## 2023-07-09 DIAGNOSIS — Z125 Encounter for screening for malignant neoplasm of prostate: Secondary | ICD-10-CM | POA: Diagnosis not present

## 2023-07-09 DIAGNOSIS — R7309 Other abnormal glucose: Secondary | ICD-10-CM | POA: Diagnosis not present

## 2023-07-09 DIAGNOSIS — D72829 Elevated white blood cell count, unspecified: Secondary | ICD-10-CM | POA: Diagnosis not present

## 2023-07-09 DIAGNOSIS — I2510 Atherosclerotic heart disease of native coronary artery without angina pectoris: Secondary | ICD-10-CM | POA: Diagnosis not present

## 2023-07-10 LAB — LAB REPORT - SCANNED
A1c: 6.3
EGFR: 82

## 2023-07-24 DIAGNOSIS — I10 Essential (primary) hypertension: Secondary | ICD-10-CM | POA: Diagnosis not present

## 2023-07-24 DIAGNOSIS — E118 Type 2 diabetes mellitus with unspecified complications: Secondary | ICD-10-CM | POA: Diagnosis not present

## 2023-07-24 DIAGNOSIS — J309 Allergic rhinitis, unspecified: Secondary | ICD-10-CM | POA: Diagnosis not present

## 2023-07-24 DIAGNOSIS — E7841 Elevated Lipoprotein(a): Secondary | ICD-10-CM | POA: Diagnosis not present

## 2023-07-24 DIAGNOSIS — F5104 Psychophysiologic insomnia: Secondary | ICD-10-CM | POA: Diagnosis not present

## 2023-07-24 DIAGNOSIS — I2510 Atherosclerotic heart disease of native coronary artery without angina pectoris: Secondary | ICD-10-CM | POA: Diagnosis not present

## 2023-07-24 DIAGNOSIS — Z0000 Encounter for general adult medical examination without abnormal findings: Secondary | ICD-10-CM | POA: Diagnosis not present

## 2023-07-24 DIAGNOSIS — M545 Low back pain, unspecified: Secondary | ICD-10-CM | POA: Diagnosis not present

## 2023-07-24 DIAGNOSIS — R0981 Nasal congestion: Secondary | ICD-10-CM | POA: Diagnosis not present

## 2023-07-24 DIAGNOSIS — J438 Other emphysema: Secondary | ICD-10-CM | POA: Diagnosis not present

## 2023-08-09 ENCOUNTER — Ambulatory Visit: Payer: Self-pay | Admitting: Cardiovascular Disease

## 2023-09-05 DIAGNOSIS — I2510 Atherosclerotic heart disease of native coronary artery without angina pectoris: Secondary | ICD-10-CM | POA: Diagnosis not present

## 2023-11-11 ENCOUNTER — Ambulatory Visit: Attending: Cardiovascular Disease | Admitting: Cardiovascular Disease

## 2023-11-11 ENCOUNTER — Encounter: Payer: Self-pay | Admitting: Cardiovascular Disease

## 2023-11-11 VITALS — BP 150/76 | HR 50 | Ht 70.5 in | Wt 203.0 lb

## 2023-11-11 DIAGNOSIS — Z8249 Family history of ischemic heart disease and other diseases of the circulatory system: Secondary | ICD-10-CM

## 2023-11-11 DIAGNOSIS — R931 Abnormal findings on diagnostic imaging of heart and coronary circulation: Secondary | ICD-10-CM | POA: Diagnosis not present

## 2023-11-11 DIAGNOSIS — E782 Mixed hyperlipidemia: Secondary | ICD-10-CM | POA: Diagnosis not present

## 2023-11-11 NOTE — Patient Instructions (Signed)

## 2023-11-11 NOTE — Assessment & Plan Note (Signed)
 History of CAD with an elevated coronary calcium  score of 1951 performed 07/27/2019 with calcium  distributed in all 3 coronary arteries.  Subsequent SPECT study performed 09/16/2019 was low risk and nonischemic.  He is active and totally asymptomatic, at goal for secondary prevention with regards to his lipids.

## 2023-11-11 NOTE — Progress Notes (Signed)
 11/11/2023 Justin Mcdaniel   September 04, 1955  [id number]  Primary Physician Clarice Nottingham, MD Primary Cardiologist: Dorn JINNY Lesches MD [facility] [place], [place]  HPI:  Justin Mcdaniel is a 68 y.o.    is a thin and fit appearing married Caucasian male father of 4 children, grandfather of 5 grandchildren is been retired from working at [employer], formally [employer].  He was referred to me by Dr. Clarice, his PCP, because of an elevated coronary calcium  score.  I apparently saw him remotely for cardiac evaluation because of an abnormal EKG. I last saw him in the office 11/07/2022.  His risk factors include family history with a brother who had multiple stents and a mother who has had stents in an elderly age.  He smoked remotely, stopped in 2012 after smoking a pack a day for 25 years.  He has untreated hyperlipidemia.  Is never had a heart attack or stroke.  He is fairly active and runs 4 to 5 miles a day.  Recent coronary calcium  score performed at [facility] on 07/27/2019 was 1951 with calcium  in all 3 coronary arteries.  Recent 2D echocardiogram performed 08/06/2019 revealed normal LV systolic function without any valvular abnormalities.   He had a gated SPECT study performed 09/16/2019 that was entirely normal.  He continues to be active.  He underwent right total hip replacement by Dr. Medford Poli 01/05/2022 which he has recuperated from.  He is running but not as actively as he was prehip replacement.  He swims as well.  He did mention that his brother whose had multiple stents in the past had bypass surgery last December.  Since I saw him a year ago he is remained stable.  He was begun on Zetia which resulted in marked improvement in his lipid profile performed 09/05/2023 with an LDL of 42.  He denies chest pain or shortness of breath.  Current Meds  Medication Sig   amLODipine  (NORVASC ) 5 MG tablet Take 5 mg by mouth daily.   aspirin  81 MG chewable tablet Chew 1 tablet (81 mg total) by mouth 2 (two)  times daily.   ezetimibe (ZETIA) 10 MG tablet daily.   fluticasone (FLONASE) 50 MCG/ACT nasal spray Place 1 spray into both nostrils daily.   metFORMIN  (GLUCOPHAGE -XR) 750 MG 24 hr tablet Take 750 mg by mouth 2 (two) times daily.   nitroGLYCERIN  (NITROSTAT ) 0.4 MG SL tablet Place 0.4 mg under the tongue every 5 (five) minutes as needed for chest pain.   rosuvastatin  (CRESTOR ) 20 MG tablet Take 20 mg by mouth daily.   zolpidem  (AMBIEN ) 10 MG tablet Take 5-10 mg by mouth at bedtime as needed for sleep.     No Known Allergies  Social History   Socioeconomic History   Marital status: Married    Spouse name: Not on file   Number of children: Not on file   Years of education: Not on file   Highest education level: Not on file  Occupational History   Not on file  Tobacco Use   Smoking status: Former    Current packs/day: 0.00    Average packs/day: 1.5 packs/day for 20.0 years (30.0 ttl pk-yrs)    Types: Cigarettes    Start date: 08/07/1990    Quit date: 08/07/2010    Years since quitting: 13.2   Smokeless tobacco: Never  Vaping Use   Vaping status: Never Used  Substance and Sexual Activity   Alcohol use: Yes    Alcohol/week: 2.0  standard drinks of alcohol    Types: 2 Cans of beer per week   Drug use: Never   Sexual activity: Not on file  Other Topics Concern   Not on file  Social History Narrative   Not on file   Social Drivers of Health   Financial Resource Strain: Not on file  Food Insecurity: No Food Insecurity (01/05/2022)   Hunger Vital Sign    Worried About Running Out of Food in the Last Year: Never true    Ran Out of Food in the Last Year: Never true  Transportation Needs: No Transportation Needs (01/05/2022)   PRAPARE - Administrator, Civil Service (Medical): No    Lack of Transportation (Non-Medical): No  Physical Activity: Not on file  Stress: Not on file  Social Connections: Unknown (08/01/2021)   Received from [facility]   Social Network     Social Network: Not on file  Intimate Partner Violence: Not At Risk (01/05/2022)   Humiliation, Afraid, Rape, and Kick questionnaire    Fear of Current or Ex-Partner: No    Emotionally Abused: No    Physically Abused: No    Sexually Abused: No     Review of Systems: General: negative for chills, fever, night sweats or weight changes.  Cardiovascular: negative for chest pain, dyspnea on exertion, edema, orthopnea, palpitations, paroxysmal nocturnal dyspnea or shortness of breath Dermatological: negative for rash Respiratory: negative for cough or wheezing Urologic: negative for hematuria Abdominal: negative for nausea, vomiting, diarrhea, bright red blood per rectum, melena, or hematemesis Neurologic: negative for visual changes, syncope, or dizziness All other systems reviewed and are otherwise negative except as noted above.    Blood pressure (!) 150/76, pulse (!) 50, height 5' 10.5 (1.791 m), weight 203 lb (92.1 kg), SpO2 97%.  General appearance: alert and no distress Neck: no adenopathy, no carotid bruit, no JVD, supple, symmetrical, trachea midline, and thyroid not enlarged, symmetric, no tenderness/mass/nodules Lungs: clear to auscultation bilaterally Heart: regular rate and rhythm, S1, S2 normal, no murmur, click, rub or gallop Extremities: extremities normal, atraumatic, no cyanosis or edema Pulses: 2+ and symmetric Skin: Skin color, texture, turgor normal. No rashes or lesions Neurologic: Grossly normal  EKG EKG Interpretation Date/Time:  Monday November 11 2023 10:36:01 EDT Ventricular Rate:  50 PR Interval:  164 QRS Duration:  86 QT Interval:  442 QTC Calculation: 402 R Axis:   11  Text Interpretation: Sinus bradycardia When compared with ECG of 07-Nov-2022 09:35, No significant change was found Confirmed by Court Carrier [phone]) on 11/11/2023 10:38:02 AM    ASSESSMENT AND PLAN:   Hyperlipidemia History of hyperlipidemia on statin therapy and Zetia with lipid  profile performed 09/05/2023 revealed a total cholesterol 119, LDL of 42 and HDL of 62.  Elevated coronary artery calcium  score History of CAD with an elevated coronary calcium  score of 1951 performed 07/27/2019 with calcium  distributed in all 3 coronary arteries.  Subsequent SPECT study performed 09/16/2019 was low risk and nonischemic.  He is active and totally asymptomatic, at goal for secondary prevention with regards to his lipids.     Carrier DOROTHA Court MD FACP,FACC,FAHA, [facility] 11/11/2023 10:50 AM

## 2023-11-11 NOTE — Assessment & Plan Note (Signed)
 History of hyperlipidemia on statin therapy and Zetia with lipid profile performed 09/05/2023 revealed a total cholesterol 119, LDL of 42 and HDL of 62.

## 2023-11-26 DIAGNOSIS — E7841 Elevated Lipoprotein(a): Secondary | ICD-10-CM | POA: Diagnosis not present

## 2023-11-26 DIAGNOSIS — E7800 Pure hypercholesterolemia, unspecified: Secondary | ICD-10-CM | POA: Diagnosis not present

## 2023-11-26 DIAGNOSIS — E1169 Type 2 diabetes mellitus with other specified complication: Secondary | ICD-10-CM | POA: Diagnosis not present

## 2023-11-26 DIAGNOSIS — I10 Essential (primary) hypertension: Secondary | ICD-10-CM | POA: Diagnosis not present

## 2023-11-26 DIAGNOSIS — I2510 Atherosclerotic heart disease of native coronary artery without angina pectoris: Secondary | ICD-10-CM | POA: Diagnosis not present

## 2024-01-28 DIAGNOSIS — L700 Acne vulgaris: Secondary | ICD-10-CM | POA: Diagnosis not present

## 2024-01-28 DIAGNOSIS — L821 Other seborrheic keratosis: Secondary | ICD-10-CM | POA: Diagnosis not present

## 2024-01-28 DIAGNOSIS — D225 Melanocytic nevi of trunk: Secondary | ICD-10-CM | POA: Diagnosis not present

## 2024-01-28 DIAGNOSIS — Z85828 Personal history of other malignant neoplasm of skin: Secondary | ICD-10-CM | POA: Diagnosis not present

## 2024-01-28 DIAGNOSIS — Z08 Encounter for follow-up examination after completed treatment for malignant neoplasm: Secondary | ICD-10-CM | POA: Diagnosis not present

## 2024-01-28 DIAGNOSIS — L814 Other melanin hyperpigmentation: Secondary | ICD-10-CM | POA: Diagnosis not present

## 2024-01-30 DIAGNOSIS — H2513 Age-related nuclear cataract, bilateral: Secondary | ICD-10-CM | POA: Diagnosis not present
